# Patient Record
Sex: Female | Born: 1950
Health system: Southern US, Community
[De-identification: ages and names within clinical notes are randomized; demographics above are authoritative.]

## PROBLEM LIST (undated history)

## (undated) DIAGNOSIS — T4145XA Adverse effect of unspecified anesthetic, initial encounter: Secondary | ICD-10-CM

## (undated) DIAGNOSIS — M1990 Unspecified osteoarthritis, unspecified site: Secondary | ICD-10-CM

## (undated) DIAGNOSIS — H269 Unspecified cataract: Secondary | ICD-10-CM

## (undated) DIAGNOSIS — Z860100 Personal history of colon polyps, unspecified: Secondary | ICD-10-CM

## (undated) DIAGNOSIS — K635 Polyp of colon: Secondary | ICD-10-CM

## (undated) DIAGNOSIS — T8859XA Other complications of anesthesia, initial encounter: Secondary | ICD-10-CM

## (undated) DIAGNOSIS — E785 Hyperlipidemia, unspecified: Secondary | ICD-10-CM

## (undated) DIAGNOSIS — E079 Disorder of thyroid, unspecified: Secondary | ICD-10-CM

## (undated) DIAGNOSIS — I10 Essential (primary) hypertension: Secondary | ICD-10-CM

## (undated) DIAGNOSIS — T7840XA Allergy, unspecified, initial encounter: Secondary | ICD-10-CM

## (undated) DIAGNOSIS — Z820 Family history of epilepsy and other diseases of the nervous system: Secondary | ICD-10-CM

## (undated) DIAGNOSIS — Z9889 Other specified postprocedural states: Secondary | ICD-10-CM

## (undated) DIAGNOSIS — R112 Nausea with vomiting, unspecified: Secondary | ICD-10-CM

## (undated) DIAGNOSIS — D869 Sarcoidosis, unspecified: Secondary | ICD-10-CM

## (undated) DIAGNOSIS — Z974 Presence of external hearing-aid: Secondary | ICD-10-CM

## (undated) HISTORY — PX: TEMPOROMANDIBULAR JOINT SURGERY: SHX35

## (undated) HISTORY — DX: Unspecified osteoarthritis, unspecified site: M19.90

## (undated) HISTORY — DX: Hyperlipidemia, unspecified: E78.5

## (undated) HISTORY — DX: Unspecified cataract: H26.9

## (undated) HISTORY — DX: Disorder of thyroid, unspecified: E07.9

## (undated) HISTORY — DX: Presence of external hearing-aid: Z97.4

## (undated) HISTORY — DX: Allergy, unspecified, initial encounter: T78.40XA

## (undated) HISTORY — DX: Family history of epilepsy and other diseases of the nervous system: Z82.0

## (undated) HISTORY — PX: OTHER SURGICAL HISTORY: SHX169

## (undated) HISTORY — PX: COLONOSCOPY: SHX174

## (undated) HISTORY — DX: Polyp of colon: K63.5

## (undated) HISTORY — DX: Personal history of colon polyps, unspecified: Z86.0100

## (undated) HISTORY — PX: POLYPECTOMY: SHX149

## (undated) HISTORY — DX: Personal history of colonic polyps: Z86.010

---

## 2002-07-15 ENCOUNTER — Encounter (INDEPENDENT_AMBULATORY_CARE_PROVIDER_SITE_OTHER): Payer: Self-pay | Admitting: *Deleted

## 2002-07-22 ENCOUNTER — Encounter (INDEPENDENT_AMBULATORY_CARE_PROVIDER_SITE_OTHER): Payer: Self-pay | Admitting: *Deleted

## 2006-09-12 ENCOUNTER — Encounter: Admission: RE | Admit: 2006-09-12 | Discharge: 2006-09-12 | Payer: Self-pay | Admitting: Obstetrics and Gynecology

## 2007-10-23 ENCOUNTER — Encounter: Admission: RE | Admit: 2007-10-23 | Discharge: 2007-10-23 | Payer: Self-pay | Admitting: Obstetrics and Gynecology

## 2007-10-30 ENCOUNTER — Encounter: Admission: RE | Admit: 2007-10-30 | Discharge: 2007-10-30 | Payer: Self-pay | Admitting: Obstetrics and Gynecology

## 2007-12-13 DIAGNOSIS — H109 Unspecified conjunctivitis: Secondary | ICD-10-CM | POA: Insufficient documentation

## 2007-12-13 DIAGNOSIS — R1013 Epigastric pain: Secondary | ICD-10-CM | POA: Insufficient documentation

## 2007-12-20 DIAGNOSIS — K219 Gastro-esophageal reflux disease without esophagitis: Secondary | ICD-10-CM | POA: Insufficient documentation

## 2007-12-20 DIAGNOSIS — H1045 Other chronic allergic conjunctivitis: Secondary | ICD-10-CM | POA: Insufficient documentation

## 2007-12-20 DIAGNOSIS — R232 Flushing: Secondary | ICD-10-CM | POA: Insufficient documentation

## 2007-12-20 DIAGNOSIS — J309 Allergic rhinitis, unspecified: Secondary | ICD-10-CM | POA: Insufficient documentation

## 2007-12-28 ENCOUNTER — Encounter: Admission: RE | Admit: 2007-12-28 | Discharge: 2007-12-28 | Payer: Self-pay | Admitting: Family Medicine

## 2008-11-27 ENCOUNTER — Encounter: Admission: RE | Admit: 2008-11-27 | Discharge: 2008-11-27 | Payer: Self-pay | Admitting: Obstetrics and Gynecology

## 2009-12-15 ENCOUNTER — Encounter: Admission: RE | Admit: 2009-12-15 | Discharge: 2009-12-15 | Payer: Self-pay | Admitting: Obstetrics and Gynecology

## 2010-05-31 ENCOUNTER — Encounter (INDEPENDENT_AMBULATORY_CARE_PROVIDER_SITE_OTHER): Payer: Self-pay | Admitting: *Deleted

## 2010-05-31 ENCOUNTER — Other Ambulatory Visit: Payer: Self-pay | Admitting: Family Medicine

## 2010-06-04 ENCOUNTER — Encounter: Payer: Self-pay | Admitting: *Deleted

## 2010-06-08 ENCOUNTER — Encounter: Payer: Self-pay | Admitting: Internal Medicine

## 2010-06-08 ENCOUNTER — Telehealth: Payer: Self-pay | Admitting: Internal Medicine

## 2010-06-08 NOTE — Letter (Signed)
Summary: Pre Visit Letter Revised  [facility]  [address] [place], [place] [address]   Phone: [phone]  Fax: [phone]        05/31/2010 MRN: [id number] Patricia Chambers [address] [place], [place]  [address]             Procedure Date:  June 22, 2010   recall col-Dr Dalene Carrow to the Gastroenterology Division at [facility].    You are scheduled to see a nurse for your pre-procedure visit on June 08, 2010 at 2:00pm on the 3rd floor at [facility], [address]  We ask that you try to arrive at our office 15 minutes prior to your appointment time to allow for check-in.  Please take a minute to review the attached form.  If you answer "Yes" to one or more of the questions on the first page, we ask that you call the person listed at your earliest opportunity.  If you answer "No" to all of the questions, please complete the rest of the form and bring it to your appointment.    Your nurse visit will consist of discussing your medical and surgical history, your immediate family medical history, and your medications.   If you are unable to list all of your medications on the form, please bring the medication bottles to your appointment and we will list them.  We will need to be aware of both prescribed and over the counter drugs.  We will need to know exact dosage information as well.    Please be prepared to read and sign documents such as consent forms, a financial agreement, and acknowledgement forms.  If necessary, and with your consent, a friend or relative is welcome to sit-in on the nurse visit with you.  Please bring your insurance card so that we may make a copy of it.  If your insurance requires a referral to see a specialist, please bring your referral form from your primary care physician.  No co-pay is required for this nurse visit.     If you cannot keep your appointment, please call [phone] to cancel or reschedule prior to  your appointment date.  This allows [place] the opportunity to schedule an appointment for another patient in need of care.    Thank you for choosing [facility] for your medical needs.  We appreciate the opportunity to care for you.  Please visit [place] at our website  to learn more about our practice.  Sincerely, The Gastroenterology Division

## 2010-06-10 ENCOUNTER — Telehealth (INDEPENDENT_AMBULATORY_CARE_PROVIDER_SITE_OTHER): Payer: Self-pay | Admitting: *Deleted

## 2010-06-15 NOTE — Letter (Signed)
Summary: Dr Samuella Cota Office Note  Dr Samuella Cota Office Note   Imported By: Lamona Curl CMA (AAMA) 06/10/2010 17:02:02  _____________________________________________________________________  External Attachment:    Type:   Image     Comment:   External Document

## 2010-06-15 NOTE — Letter (Signed)
Summary: [facility] Instructions  [facility]  [address] [place], [place] [address]   Phone: [phone]  Fax: [phone]       Jlee Tribbett    02-06-1951    MRN: [id number]        Procedure Day /Date: Tuesday 06-22-10     Arrival Time: 10:30 am     Procedure Time: 11:30 am     Location of Procedure:                    _x _   Endoscopy Center (4th Floor)   PREPARATION FOR COLONOSCOPY WITH MOVIPREP   Starting 5 days prior to your procedure  06-17-10 do not eat nuts, seeds, popcorn, corn, beans, peas,  salads, or any raw vegetables.  Do not take any fiber supplements (e.g. Metamucil, Citrucel, and Benefiber).  THE DAY BEFORE YOUR PROCEDURE         DATE: 06-21-10  DAY:  Monday   1.  Drink clear liquids the entire day-NO SOLID FOOD  2.  Do not drink anything colored red or purple.  Avoid juices with pulp.  No orange juice.  3.  Drink at least 64 oz. (8 glasses) of fluid/clear liquids during the day to prevent dehydration and help the prep work efficiently.  CLEAR LIQUIDS INCLUDE: Water Jello Ice Popsicles Tea (sugar ok, no milk/cream) Powdered fruit flavored drinks Coffee (sugar ok, no milk/cream) Gatorade Juice: apple, white grape, white cranberry  Lemonade Clear bullion, consomm, broth Carbonated beverages (any kind) Strained chicken noodle soup Hard Candy                             4.  In the morning, mix first dose of MoviPrep solution:    Empty 1 Pouch A and 1 Pouch B into the disposable container    Add lukewarm drinking water to the top line of the container. Mix to dissolve    Refrigerate (mixed solution should be used within 24 hrs)  5.  Begin drinking the prep at 5:00 p.m. The MoviPrep container is divided by 4 marks.   Every 15 minutes drink the solution down to the next mark (approximately 8 oz) until the full liter is complete.   6.  Follow completed prep with 16 oz of clear liquid of your choice (Nothing red or purple).   Continue to drink clear liquids until bedtime.  7.  Before going to bed, mix second dose of MoviPrep solution:    Empty 1 Pouch A and 1 Pouch B into the disposable container    Add lukewarm drinking water to the top line of the container. Mix to dissolve    Refrigerate  THE DAY OF YOUR PROCEDURE      DATE:  06-22-10  DAY:  Tuesday  Beginning at  6:30 a.m. (5 hours before procedure):         1. Every 15 minutes, drink the solution down to the next mark (approx 8 oz) until the full liter is complete.  2. Follow completed prep with 16 oz. of clear liquid of your choice.    3. You may drink clear liquids until  9:30 a.m. (2 HOURS BEFORE PROCEDURE).   MEDICATION INSTRUCTIONS  Unless otherwise instructed, you should take regular prescription medications with a small sip of water   as early as possible the morning of your procedure.           OTHER  INSTRUCTIONS  You will need a responsible adult at least 61 years of age to accompany you and drive you home.   This person must remain in the waiting room during your procedure.  Wear loose fitting clothing that is easily removed.  Leave jewelry and other valuables at home.  However, you may wish to bring a book to read or  an iPod/MP3 player to listen to music as you wait for your procedure to start.  Remove all body piercing jewelry and leave at home.  Total time from sign-in until discharge is approximately 2-3 hours.  You should go home directly after your procedure and rest.  You can resume normal activities the  day after your procedure.  The day of your procedure you should not:   Drive   Make legal decisions   Operate machinery   Drink alcohol   Return to work  You will receive specific instructions about eating, activities and medications before you leave.    The above instructions have been reviewed and explained to me by   Ezra Sites RN  June 08, 2010 2:35 PM   I fully understand and can verbalize  these instructions _____________________________ Date _________

## 2010-06-15 NOTE — Letter (Signed)
Summary: Pathology (endo/colon)  Pathology (endo/colon)   Imported By: Lamona Curl CMA (AAMA) 06/11/2010 09:35:33  _____________________________________________________________________  External Attachment:    Type:   Image     Comment:   External Document

## 2010-06-15 NOTE — Progress Notes (Signed)
  Phone Note Other Incoming   Request: Send information Summary of Call: Records received from Dr. Knute Neu. 5 pages forwarded to Dr. Juanda Chance for review.

## 2010-06-15 NOTE — Letter (Signed)
Summary: COLON (Dr Samuella Cota)  COLON (Dr Samuella Cota)   Imported By: Lamona Curl CMA (AAMA) 06/11/2010 09:39:08  _____________________________________________________________________  External Attachment:    Type:   Image     Comment:   External Document

## 2010-06-15 NOTE — Letter (Signed)
Summary: EGD (Dr Samuella Cota)  EGD (Dr Samuella Cota)   Imported By: Lamona Curl CMA (AAMA) 06/11/2010 09:34:36  _____________________________________________________________________  External Attachment:    Type:   Image     Comment:   External Document

## 2010-06-15 NOTE — Miscellaneous (Signed)
Summary: [facility]  Clinical Lists Changes  Medications: Added new medication of MOVIPREP 100 GM  SOLR (PEG-KCL-NACL-NASULF-NA ASC-C) As per prep instructions. - Signed Rx of MOVIPREP 100 GM  SOLR (PEG-KCL-NACL-NASULF-NA ASC-C) As per prep instructions.;  #1 x 0;  Signed;  Entered by: Ezra Sites RN;  Authorized by: Hart Carwin MD;  Method used: Electronically to [facility] # [address], [place], [place]  [address], Ph: [phone], Fax: [phone] Allergies: Added new allergy or adverse reaction of PCN Observations: Added new observation of NKA: F (06/08/2010 13:52)    Prescriptions: MOVIPREP 100 GM  SOLR (PEG-KCL-NACL-NASULF-NA ASC-C) As per prep instructions.  #1 x 0   Entered by:   Ezra Sites RN   Authorized by:   Hart Carwin MD   Signed by:   Ezra Sites RN on 06/08/2010   Method used:   Electronically to        [facility] # [address]* (retail)       [address]       [place], [place]  [address]       Ph: [phone]       Fax: [phone]   RxID:   [id number]

## 2010-06-15 NOTE — Progress Notes (Signed)
Summary: Info so we can request last Colon  Phone Note Call from Patient Call back at Home Phone [phone]   Call For: Dr Juanda Chance Summary of Call: Needs to have records sent to [place] could not remember Correct Clinic when she was here earlier - Dr Knute Neu @ [facility] [address] [place] [phone] 2005 she believes. She was told to ask for Dottie & give this info. Initial call taken by: Leanor Kail [facility],  June 08, 2010 3:11 PM  Follow-up for Phone Call        records requested. Awaiting response. Follow-up by: Lamona Curl CMA Duncan Dull),  June 08, 2010 4:02 PM

## 2010-06-21 ENCOUNTER — Encounter: Payer: Self-pay | Admitting: Internal Medicine

## 2010-06-22 ENCOUNTER — Ambulatory Visit (AMBULATORY_SURGERY_CENTER): Payer: BC Managed Care – PPO | Admitting: Internal Medicine

## 2010-06-22 ENCOUNTER — Encounter: Payer: Self-pay | Admitting: Internal Medicine

## 2010-06-22 VITALS — BP 144/82 | HR 83 | Temp 98.1°F | Resp 12 | Ht 67.0 in | Wt 180.0 lb

## 2010-06-22 DIAGNOSIS — Z1211 Encounter for screening for malignant neoplasm of colon: Secondary | ICD-10-CM

## 2010-06-22 DIAGNOSIS — Z86010 Personal history of colonic polyps: Secondary | ICD-10-CM

## 2010-06-22 DIAGNOSIS — K5730 Diverticulosis of large intestine without perforation or abscess without bleeding: Secondary | ICD-10-CM

## 2010-06-22 NOTE — Patient Instructions (Signed)
Findings: Diverticulosis  Recommendations: 10 year colonoscopy recall                                   High fiber diet

## 2010-06-23 ENCOUNTER — Telehealth: Payer: Self-pay

## 2010-06-23 NOTE — Telephone Encounter (Signed)

## 2010-06-29 NOTE — Procedures (Signed)
Summary: Colonoscopy  Patient: [name] Note: All result statuses are Final unless otherwise noted.  Tests: (1) Colonoscopy (COL)   COL Colonoscopy           DONE     [facility]     [address]     [place], [place]  [address]          COLONOSCOPY PROCEDURE REPORT          PATIENT:  Patricia Chambers, Patricia Chambers  MR#:  [id number]     BIRTHDATE:  04/03/1950, 59 yrs. old  GENDER:  female     ENDOSCOPIST:  Hedwig Morton. Juanda Chance, MD     REF. BY:  Ancil Boozer, M.D.     PROCEDURE DATE:  06/22/2010     PROCEDURE:  Colonoscopy [address]     ASA CLASS:  Class II     INDICATIONS:  colorectal cancer screening, average risk, history     of hyperplastic polyps hyperplastic polyp in 2004     MEDICATIONS:   Versed 10 mg, Fentanyl 100 mcg          DESCRIPTION OF PROCEDURE:   After the risks benefits and     alternatives of the procedure were thoroughly explained, informed     consent was obtained.  Digital rectal exam was performed and     revealed no rectal masses.   The LB [id number]-[id number] endoscope     was introduced through the anus and advanced to the cecum, which     was identified by both the appendix and ileocecal valve, without     limitations.  The quality of the prep was good, using MoviPrep.     The instrument was then slowly withdrawn as the colon was fully     examined.     <<PROCEDUREIMAGES>>          FINDINGS:  Moderate diverticulosis was found throughout the colon     (see image1, image2, image6, image7, and image8).  This was     otherwise a normal examination of the colon (see image9, image5,     image4, and image3).   Retroflexed views in the rectum revealed no     abnormalities.    The scope was then withdrawn from the patient     and the procedure completed.          COMPLICATIONS:  None     ENDOSCOPIC IMPRESSION:     1) Moderate diverticulosis throughout the colon     2) Otherwise normal examination     RECOMMENDATIONS:     1) high fiber diet     REPEAT EXAM:  In  10 year(s) for.          ______________________________     Hedwig Morton. Juanda Chance, MD          CC:          n.     eSIGNED:   Hedwig Morton. Ranette Luckadoo at 06/22/2010 11:54 AM          Nilsa Nutting, [id number]  Note: An exclamation mark (!) indicates a result that was not dispersed into the flowsheet. Document Creation Date: 06/22/2010 11:54 AM _______________________________________________________________________  (1) Order result status: Final Collection or observation date-time: 06/22/2010 11:39 Requested date-time:  Receipt date-time:  Reported date-time:  Referring Physician:   Ordering Physician: Lina Sar [id number]) Specimen Source:  Source: Launa Grill Order Number: [id number] Lab site:

## 2010-11-15 ENCOUNTER — Emergency Department (HOSPITAL_COMMUNITY)
Admission: EM | Admit: 2010-11-15 | Discharge: 2010-11-15 | Disposition: A | Discharge status: Home / Self Care | Payer: BC Managed Care – PPO | Attending: Emergency Medicine | Admitting: Emergency Medicine

## 2010-11-15 ENCOUNTER — Emergency Department (HOSPITAL_COMMUNITY): Payer: BC Managed Care – PPO

## 2010-11-15 DIAGNOSIS — M545 Low back pain, unspecified: Secondary | ICD-10-CM | POA: Insufficient documentation

## 2010-11-15 DIAGNOSIS — M25559 Pain in unspecified hip: Secondary | ICD-10-CM | POA: Insufficient documentation

## 2010-11-15 DIAGNOSIS — M549 Dorsalgia, unspecified: Secondary | ICD-10-CM | POA: Insufficient documentation

## 2010-11-24 ENCOUNTER — Other Ambulatory Visit: Payer: Self-pay | Admitting: Family Medicine

## 2010-11-24 DIAGNOSIS — Z1231 Encounter for screening mammogram for malignant neoplasm of breast: Secondary | ICD-10-CM

## 2010-12-21 ENCOUNTER — Ambulatory Visit
Admission: RE | Admit: 2010-12-21 | Discharge: 2010-12-21 | Disposition: A | Discharge status: Home / Self Care | Payer: BC Managed Care – PPO | Source: Ambulatory Visit | Attending: Family Medicine | Admitting: Family Medicine

## 2010-12-21 DIAGNOSIS — Z1231 Encounter for screening mammogram for malignant neoplasm of breast: Secondary | ICD-10-CM

## 2011-10-31 ENCOUNTER — Other Ambulatory Visit: Payer: Self-pay | Admitting: Family Medicine

## 2011-10-31 DIAGNOSIS — Z1151 Encounter for screening for human papillomavirus (HPV): Secondary | ICD-10-CM | POA: Insufficient documentation

## 2011-10-31 DIAGNOSIS — Z124 Encounter for screening for malignant neoplasm of cervix: Secondary | ICD-10-CM | POA: Insufficient documentation

## 2011-11-04 ENCOUNTER — Other Ambulatory Visit (HOSPITAL_COMMUNITY)
Admission: RE | Admit: 2011-11-04 | Discharge: 2011-11-04 | Disposition: A | Discharge status: Home / Self Care | Payer: BC Managed Care – PPO | Source: Ambulatory Visit | Attending: Family Medicine | Admitting: Family Medicine

## 2011-12-14 ENCOUNTER — Other Ambulatory Visit: Payer: Self-pay | Admitting: Family Medicine

## 2011-12-14 DIAGNOSIS — Z1231 Encounter for screening mammogram for malignant neoplasm of breast: Secondary | ICD-10-CM

## 2012-01-03 ENCOUNTER — Ambulatory Visit
Admission: RE | Admit: 2012-01-03 | Discharge: 2012-01-03 | Disposition: A | Discharge status: Home / Self Care | Payer: BC Managed Care – PPO | Source: Ambulatory Visit | Attending: Family Medicine | Admitting: Family Medicine

## 2012-01-03 DIAGNOSIS — Z1231 Encounter for screening mammogram for malignant neoplasm of breast: Secondary | ICD-10-CM

## 2013-06-14 ENCOUNTER — Encounter: Payer: Self-pay | Admitting: Internal Medicine

## 2013-06-14 ENCOUNTER — Ambulatory Visit (INDEPENDENT_AMBULATORY_CARE_PROVIDER_SITE_OTHER): Payer: BC Managed Care – PPO | Admitting: Internal Medicine

## 2013-06-14 VITALS — BP 135/95 | HR 90 | Ht 67.0 in | Wt 188.8 lb

## 2013-06-14 DIAGNOSIS — R079 Chest pain, unspecified: Secondary | ICD-10-CM

## 2013-06-14 DIAGNOSIS — R0602 Shortness of breath: Secondary | ICD-10-CM

## 2013-06-14 LAB — LIPID PANEL
Cholesterol: 245 mg/dL — ABNORMAL HIGH (ref 0–200)
HDL: 55.4 mg/dL (ref >39.00)
LDL Cholesterol: 150 mg/dL — ABNORMAL HIGH (ref 0–99)
Total CHOL/HDL Ratio: 4
Triglycerides: 200 mg/dL — ABNORMAL HIGH (ref 0.0–149.0)
VLDL: 40 mg/dL (ref 0.0–40.0)

## 2013-06-14 NOTE — Progress Notes (Signed)
HPI BP has been mildly elevated  Chol 229 1 year ago  [employer].  Then fell off diet Pain came and went then to R jaw  Went awy  No more since  SInce then hasn;t felt right.  Taking BP it has been high  Lows have been 138/80  SOB on Tues  Walked from car to house  Had to stop This was odd.   Tues night got to go to [facility]  Back in bed.  Racing  Wed went to Dr Orland Mustard  No SOB  But tight Took med  Tightness gone.     Allergies  Allergen Reactions  . Penicillins     REACTION: as infant    Current Outpatient Prescriptions  Medication Sig Dispense Refill  . Ascorbic Acid (VITAMIN C) 500 MG tablet Take 500 mg by mouth daily.        . hydrochlorothiazide (MICROZIDE) 12.5 MG capsule Take 12.5 mg by mouth daily.      . multivitamin (THERAGRAN) per tablet Take 1 tablet by mouth daily.        . Omega-3 Fatty Acids (FISH OIL PO) Take 500 mg by mouth daily.         No current facility-administered medications for this visit.    Past Medical History  Diagnosis Date  . Asthma   . FHx: migraine headaches   . Hx of colonic polyps     Past Surgical History  Procedure Laterality Date  . Temporomandibular joint surgery      No family history on file.  History   Social History  . Marital Status: Married    Spouse Name: N/A    Number of Children: N/A  . Years of Education: N/A   Occupational History  . Not on file.   Social History Main Topics  . Smoking status: Former Research scientist (life sciences)  . Smokeless tobacco: Not on file  . Alcohol Use: 3.0 oz/week    5 Glasses of wine per week  . Drug Use: No  . Sexual Activity:    Other Topics Concern  . Not on file   Social History Narrative  . No narrative on file    Review of Systems:  All systems reviewed.  They are negative to the above problem except as previously stated.  Vital Signs: BP 135/95  Pulse 90  Ht 5\' 7"  (1.702 m)  Wt 188 lb 12.8 oz (85.639 kg)  BMI 29.56 kg/m2  Physical Exam Patient is i nNAD HEENT:  Normocephalic,  atraumatic. EOMI, PERRLA.  Neck: JVP is normal.  No bruits.  Lungs: clear to auscultation. No rales no wheezes.  Heart: Regular rate and rhythm. Normal S1, S2. No S3.   No significant murmurs. PMI not displaced.  Abdomen:  Supple, nontender. Normal bowel sounds. No masses. No hepatomegaly.  Extremities:   Good distal pulses throughout. No lower extremity edema.  Musculoskeletal :moving all extremities.  Neuro:   alert and oriented x3.  CN II-XII grossly intact.  EKG  NSR 85 bpm.  Nonspeciifc ST T wave changes.  Assessment and Plan:  1.  SOB and tightness.  Concerning  WOuld set up for stress myoview  2.  HTN  BP is high today  Check on return for test.  3.  HL  Need fasting lipids.

## 2013-06-14 NOTE — Patient Instructions (Addendum)
Your physician has requested that you have en exercise stress myoview. For further information please visit [email or web address]. Please follow instruction sheet, as given.  Your physician recommends that you continue on your current medications as directed. Please refer to the Current Medication list given to you today.  Your physician recommends that you return for lab work in: [place]

## 2013-06-20 ENCOUNTER — Emergency Department (HOSPITAL_COMMUNITY): Payer: BC Managed Care – PPO

## 2013-06-20 ENCOUNTER — Observation Stay (HOSPITAL_COMMUNITY)
Admission: EM | Admit: 2013-06-20 | Discharge: 2013-06-21 | Disposition: A | Discharge status: Home / Self Care | Payer: BC Managed Care – PPO | Attending: Internal Medicine | Admitting: Internal Medicine

## 2013-06-20 ENCOUNTER — Encounter (HOSPITAL_COMMUNITY): Payer: Self-pay | Admitting: Emergency Medicine

## 2013-06-20 DIAGNOSIS — Z820 Family history of epilepsy and other diseases of the nervous system: Secondary | ICD-10-CM | POA: Insufficient documentation

## 2013-06-20 DIAGNOSIS — Z8249 Family history of ischemic heart disease and other diseases of the circulatory system: Secondary | ICD-10-CM

## 2013-06-20 DIAGNOSIS — Z860100 Personal history of colon polyps, unspecified: Secondary | ICD-10-CM | POA: Insufficient documentation

## 2013-06-20 DIAGNOSIS — I10 Essential (primary) hypertension: Secondary | ICD-10-CM | POA: Diagnosis present

## 2013-06-20 DIAGNOSIS — D869 Sarcoidosis, unspecified: Secondary | ICD-10-CM | POA: Insufficient documentation

## 2013-06-20 DIAGNOSIS — R079 Chest pain, unspecified: Principal | ICD-10-CM | POA: Diagnosis present

## 2013-06-20 DIAGNOSIS — E785 Hyperlipidemia, unspecified: Secondary | ICD-10-CM | POA: Diagnosis present

## 2013-06-20 DIAGNOSIS — R112 Nausea with vomiting, unspecified: Secondary | ICD-10-CM | POA: Insufficient documentation

## 2013-06-20 HISTORY — DX: Other complications of anesthesia, initial encounter: T88.59XA

## 2013-06-20 HISTORY — DX: Other specified postprocedural states: R11.2

## 2013-06-20 HISTORY — DX: Adverse effect of unspecified anesthetic, initial encounter: T41.45XA

## 2013-06-20 HISTORY — DX: Essential (primary) hypertension: I10

## 2013-06-20 HISTORY — DX: Other specified postprocedural states: Z98.890

## 2013-06-20 HISTORY — DX: Sarcoidosis, unspecified: D86.9

## 2013-06-20 LAB — COMPREHENSIVE METABOLIC PANEL
ALT: 23 U/L (ref 0–35)
AST: 25 U/L (ref 0–37)
Albumin: 4.4 g/dL (ref 3.5–5.2)
Alkaline Phosphatase: 51 U/L (ref 39–117)
BUN: 19 mg/dL (ref 6–23)
CO2: 24 mEq/L (ref 19–32)
Calcium: 9.6 mg/dL (ref 8.4–10.5)
Chloride: 99 mEq/L (ref 96–112)
Creatinine, Ser: 0.99 mg/dL (ref 0.50–1.10)
GFR calc Af Amer: 69 mL/min — ABNORMAL LOW (ref >90)
GFR calc non Af Amer: 59 mL/min — ABNORMAL LOW (ref >90)
Glucose, Bld: 108 mg/dL — ABNORMAL HIGH (ref 70–99)
Potassium: 3.4 mEq/L — ABNORMAL LOW (ref 3.7–5.3)
Sodium: 139 mEq/L (ref 137–147)
Total Bilirubin: 0.6 mg/dL (ref 0.3–1.2)
Total Protein: 7.2 g/dL (ref 6.0–8.3)

## 2013-06-20 LAB — CBC
HCT: 40.1 % (ref 36.0–46.0)
Hemoglobin: 13.5 g/dL (ref 12.0–15.0)
MCH: 30.7 pg (ref 26.0–34.0)
MCHC: 33.7 g/dL (ref 30.0–36.0)
MCV: 91.1 fL (ref 78.0–100.0)
Platelets: 222 10*3/uL (ref 150–400)
RBC: 4.4 MIL/uL (ref 3.87–5.11)
RDW: 13 % (ref 11.5–15.5)
WBC: 6.7 10*3/uL (ref 4.0–10.5)

## 2013-06-20 LAB — I-STAT TROPONIN, ED: Troponin i, poc: 0 ng/mL (ref 0.00–0.08)

## 2013-06-20 MED ORDER — ASPIRIN EC 81 MG PO TBEC: 81.0000 mg | DELAYED_RELEASE_TABLET | Freq: Every day | ORAL | Status: DC

## 2013-06-20 MED ORDER — MULTIVITAMINS PO TABS: 1.0000 | ORAL_TABLET | Freq: Every day | ORAL | Status: DC

## 2013-06-20 MED ORDER — HYDROCHLOROTHIAZIDE 12.5 MG PO CAPS
12.5000 mg | ORAL_CAPSULE | Freq: Every day | ORAL | Status: DC
  Administered 2013-06-21: 12.5 mg via ORAL
  Filled 2013-06-20: qty 1

## 2013-06-20 MED ORDER — NITROGLYCERIN 0.4 MG SL SUBL: 0.4000 mg | SUBLINGUAL_TABLET | SUBLINGUAL | Status: DC | PRN

## 2013-06-20 MED ORDER — ENOXAPARIN SODIUM 40 MG/0.4ML ~~LOC~~ SOLN: 40.0000 mg | SUBCUTANEOUS | Status: DC

## 2013-06-20 MED ORDER — ASPIRIN EC 81 MG PO TBEC
81.0000 mg | DELAYED_RELEASE_TABLET | Freq: Every day | ORAL | Status: DC
  Administered 2013-06-21: 81 mg via ORAL
  Filled 2013-06-20: qty 1

## 2013-06-20 MED ORDER — ENOXAPARIN SODIUM 40 MG/0.4ML ~~LOC~~ SOLN
40.0000 mg | SUBCUTANEOUS | Status: DC
  Administered 2013-06-21: 40 mg via SUBCUTANEOUS
  Filled 2013-06-20 (×2): qty 0.4

## 2013-06-20 MED ORDER — ASPIRIN 81 MG PO CHEW
324.0000 mg | CHEWABLE_TABLET | Freq: Once | ORAL | Status: AC
  Administered 2013-06-20: 324 mg via ORAL
  Filled 2013-06-20 (×2): qty 4

## 2013-06-20 MED ORDER — VITAMIN C 500 MG PO TABS
500.0000 mg | ORAL_TABLET | Freq: Every day | ORAL | Status: DC
  Administered 2013-06-21: 500 mg via ORAL
  Filled 2013-06-20: qty 1

## 2013-06-20 MED ORDER — ATORVASTATIN CALCIUM 80 MG PO TABS
80.0000 mg | ORAL_TABLET | Freq: Every day | ORAL | Status: DC
  Filled 2013-06-20: qty 1

## 2013-06-20 MED ORDER — ADULT MULTIVITAMIN W/MINERALS CH
1.0000 | ORAL_TABLET | Freq: Every day | ORAL | Status: DC
  Administered 2013-06-21: 1 via ORAL
  Filled 2013-06-20: qty 1

## 2013-06-20 MED ORDER — NITROGLYCERIN 0.4 MG SL SUBL
0.4000 mg | SUBLINGUAL_TABLET | SUBLINGUAL | Status: DC | PRN
  Administered 2013-06-20 (×2): 0.4 mg via SUBLINGUAL
  Filled 2013-06-20: qty 1

## 2013-06-20 NOTE — H&P (Signed)
Patient ID: Patricia Chambers MRN: [id number], DOB/AGE: 1951-01-03   Admit date: 06/20/2013   Primary Physician: Wynelle Fanny Primary Cardiologist: Dorris Carnes, MD  Pt. Profile:  63F with history of HTN and HLD and remote history of smoking admitted with chest pain.   Problem List  Past Medical History  Diagnosis Date  . Asthma   . FHx: migraine headaches   . Hx of colonic polyps   . Hypertension   . Sarcoidosis   . Complication of anesthesia     hard to wake up  . PONV (postoperative nausea and vomiting)     Past Surgical History  Procedure Laterality Date  . Temporomandibular joint surgery       Allergies  Allergies  Allergen Reactions  . Naproxen     Esophageal burning sensation.    Marland Kitchen Penicillins     REACTION: as infant    HPI  63F with history of HTN and HLD and remote history of smoking admitted with chest pain.   States 10 days ago she developed 4-5/10 chest and jaw pain while seated on her porch drinking wine and watching her dog play. Debated whether to call EMS but pain subsided after 30 minutes. The day after she developed SOB and fatigue.   She saw a physician and was noted to be hypertensive at 160s/90s. She was started on HCTZ and stress test was planned for 4/1.  Today while in the line at [facility] She developed more severe 5-6/10 chest and jaw pain with "queesyness" and lightheadedness. EMS was called. Episode lasted 30 minutes. Reports after it subsided she had a residual tightness which resolved with nitro. She was hypertensive (164/88) and mildly tachycardic (110) on arrival.   States she has had significant increase in stress lately associated with husband's Merkel cell cancer and resultant financial issues.  Also states she had recent lipid panel that demonstrated TC of 245 and HDL 55 -- cannot remember LDL. Reports strong FH of CAD in mother's side.   Home Medications  Prior to Admission medications   Medication Sig Start Date  End Date Taking? Authorizing Provider  Ascorbic Acid (VITAMIN C) 500 MG tablet Take 500 mg by mouth daily.     Yes Historical Provider, MD  aspirin EC 81 MG tablet Take 81 mg by mouth daily.   Yes Historical Provider, MD  hydrochlorothiazide (MICROZIDE) 12.5 MG capsule Take 12.5 mg by mouth daily. 06/12/13  Yes Historical Provider, MD  multivitamin [facility]) per tablet Take 1 tablet by mouth daily.     Yes Historical Provider, MD    Family History  History reviewed. No pertinent family history.  Social History  History   Social History  . Marital Status: Married    Spouse Name: N/A    Number of Children: N/A  . Years of Education: N/A   Occupational History  . Not on file.   Social History Main Topics  . Smoking status: Former Smoker    Quit date: 06/20/1972  . Smokeless tobacco: Never Used  . Alcohol Use: 3.0 oz/week    5 Glasses of wine per week     Comment: Or more  . Drug Use: No  . Sexual Activity: Not on file   Other Topics Concern  . Not on file   Social History Narrative  . No narrative on file     Review of Systems General:  No chills, fever, night sweats or weight changes.  Cardiovascular:  Noedema, orthopnea, palpitations, paroxysmal nocturnal  dyspnea. Dermatological: No rash, lesions/masses Respiratory: No cough, dyspnea Urologic: No hematuria, dysuria Abdominal:   No nausea, vomiting, diarrhea, bright red blood per rectum, melena, or hematemesis Neurologic:  No visual changes, wkns, changes in mental status. All other systems reviewed and are otherwise negative except as noted above.  Physical Exam  Blood pressure 149/80, pulse 81, temperature 98.9 F (37.2 C), temperature source Oral, resp. rate 18, height 5\' 7"  (1.702 m), weight 84.913 kg (187 lb 3.2 oz), SpO2 99.00%.  General: Pleasant, NAD Psych: Normal affect. Neuro: Alert and oriented X 3. Moves all extremities spontaneously. HEENT: Normal  Neck: Supple without bruits or JVD. Lungs:   Resp regular and unlabored, CTA. Heart: RRR no s3, s4, or murmurs. Abdomen: Soft, non-tender, non-distended, BS + x 4.  Extremities: No clubbing, cyanosis or edema. DP/PT/Radials 2+ and equal bilaterally.  Labs  Troponin [facility] of Care Test)  Recent Labs  06/20/13 1601  TROPIPOC 0.00   No results found for this basename: CKTOTAL, CKMB, TROPONINI,  in the last 72 hours Lab Results  Component Value Date   WBC 6.7 06/20/2013   HGB 13.5 06/20/2013   HCT 40.1 06/20/2013   MCV 91.1 06/20/2013   PLT 222 06/20/2013    Recent Labs Lab 06/20/13 1556  NA 139  K 3.4*  CL 99  CO2 24  BUN 19  CREATININE 0.99  CALCIUM 9.6  PROT 7.2  BILITOT 0.6  ALKPHOS 51  ALT 23  AST 25  GLUCOSE 108*   Lab Results  Component Value Date   CHOL 245* 06/14/2013   HDL 55.40 06/14/2013   LDLCALC 150* 06/14/2013   TRIG 200.0* 06/14/2013   No results found for this basename: DDIMER     Radiology/Studies  Dg Chest 2 View  06/20/2013   CLINICAL DATA:  Chest pain.  History of hypertension and asthma.  EXAM: CHEST  2 VIEW  COMPARISON:  None.  FINDINGS: The heart size and mediastinal contours are within normal limits. Both lungs are clear. The visualized skeletal structures are unremarkable.  IMPRESSION: No active cardiopulmonary disease.   Electronically Signed   By: Shon Hale M.D.   On: 06/20/2013 16:48    ECG NSR. No STTWC c/w acute ischemia  ASSESSMENT AND PLAN  63F with history of HTN, HLD, remote history of smoking, with strong FH of CAD who admitted with typical anginal symptoms.  NPO for stress test in AM.   Signed, Lamar Sprinkles, MD 06/20/2013, 9:42 PM

## 2013-06-20 NOTE — ED Provider Notes (Signed)
CSN: [id number]     Arrival date & time 06/20/13  1523 History   First MD Initiated Contact with Patient 06/20/13 1639     Chief Complaint  Patient presents with  . Chest Pain     (Consider location/radiation/quality/duration/timing/severity/associated sxs/prior Treatment) HPI  This is a 63 year old female with a history of hypertension and hyperlipidemia who presents with chest pain. Patient reports she had onset of chest pain at 3 PM this afternoon while she was grocery shopping. She reports that it is midsternal and nonradiating. She reports that it was a "heaviness." She states that it lasted approximately 30 minutes.  Patient also reported jaw pain. States that she had a similar episode 2 weeks ago and saw her primary Dr. With that episode she also had some shortness of breath and dizziness. She was referred to cardiology and has a stress test scheduled for April 1. She was seen by Wyatt Haste. Patient currently rates pain 3/10. She has not taken aspirin today. Strong family history of heart disease.  Past Medical History  Diagnosis Date  . Asthma   . FHx: migraine headaches   . Hx of colonic polyps   . Hypertension    Past Surgical History  Procedure Laterality Date  . Temporomandibular joint surgery     No family history on file. History  Substance Use Topics  . Smoking status: Former Research scientist (life sciences)  . Smokeless tobacco: Not on file  . Alcohol Use: 3.0 oz/week    5 Glasses of wine per week   OB History   Grav Para Term Preterm Abortions TAB SAB Ect Mult Living                 Review of Systems  Constitutional: Negative for fever.  Respiratory: Positive for chest tightness. Negative for cough and shortness of breath.   Cardiovascular: Positive for chest pain. Negative for palpitations and leg swelling.  Gastrointestinal: Negative for nausea and vomiting.  Genitourinary: Negative for dysuria.  Musculoskeletal: Negative for back pain.  Neurological: Negative for headaches.   All other systems reviewed and are negative.      Allergies  Naproxen and Penicillins  Home Medications   Current Outpatient Rx  Name  Route  Sig  Dispense  Refill  . Ascorbic Acid (VITAMIN C) 500 MG tablet   Oral   Take 500 mg by mouth daily.           Marland Kitchen aspirin EC 81 MG tablet   Oral   Take 81 mg by mouth daily.         . hydrochlorothiazide (MICROZIDE) 12.5 MG capsule   Oral   Take 12.5 mg by mouth daily.         . multivitamin (THERAGRAN) per tablet   Oral   Take 1 tablet by mouth daily.            BP 164/88  Pulse 110  Temp(Src) 97.7 F (36.5 C) (Oral)  Resp 16  Ht 5\' 7"  (1.702 m)  Wt 188 lb (85.276 kg)  BMI 29.44 kg/m2  SpO2 100% Physical Exam  Nursing note and vitals reviewed. Constitutional: She is oriented to person, place, and time. She appears well-developed and well-nourished. No distress.  HENT:  Head: Normocephalic and atraumatic.  Eyes: Pupils are equal, round, and reactive to light.  Neck: Neck supple.  Cardiovascular: Normal rate, regular rhythm and normal heart sounds.   No murmur heard. Pulmonary/Chest: Effort normal and breath sounds normal. No respiratory distress. She has no  wheezes. She has no rales.  Abdominal: Soft. There is no tenderness. There is no rebound.  Musculoskeletal: She exhibits no edema.  Neurological: She is alert and oriented to person, place, and time.  Skin: Skin is warm and dry.  Psychiatric: She has a normal mood and affect.    ED Course  Procedures (including critical care time) Labs Review Labs Reviewed  COMPREHENSIVE METABOLIC PANEL - Abnormal; Notable for the following:    Potassium 3.4 (*)    Glucose, Bld 108 (*)    GFR calc non Af Amer 59 (*)    GFR calc Af Amer 69 (*)    All other components within normal limits  CBC  I-STAT TROPOININ, ED   Imaging Review Dg Chest 2 View  06/20/2013   CLINICAL DATA:  Chest pain.  History of hypertension and asthma.  EXAM: CHEST  2 VIEW  COMPARISON:   None.  FINDINGS: The heart size and mediastinal contours are within normal limits. Both lungs are clear. The visualized skeletal structures are unremarkable.  IMPRESSION: No active cardiopulmonary disease.   Electronically Signed   By: Shon Hale M.D.   On: 06/20/2013 16:48     EKG Interpretation   Date/Time:  Thursday June 20 2013 15:37:23 EDT Ventricular Rate:  96 PR Interval:  158 QRS Duration: 90 QT Interval:  350 QTC Calculation: 442 R Axis:   -7 Text Interpretation:  Sinus rhythm Borderline T wave abnormalities No  prior for comparison Confirmed by Danelle Curiale  MD, Carletha Dawn ([id number]) on  06/20/2013 4:42:58 PM      MDM   Final diagnoses:  Chest pain    Patient presents for chest pain. Story and history is concerning for angina. Patient has multiple risk factors and a family history. Initial EKG is nonischemic. Troponin is negative. Chest x-ray is clear. Patient was given aspirin and nitroglycerin. Will consult cardiology and anticipate admission for expedited stress testing.    Merryl Hacker, MD 06/21/13 [phone]

## 2013-06-20 NOTE — ED Notes (Signed)
Pt reports unchanged chest pressure 3/10 after first dose of nitro.

## 2013-06-20 NOTE — ED Notes (Signed)
Pt states she was at store at 1500 and developed mid sternal chest pain. Pt denies chest pain at present. Pt states that deep breathing made the pain worse. Pt also states that she had L side jaw pain at the time. Pt denies nausea or SOB. Pt states she feels a little lightheaded. Pt alert, no acute distress. Skin warm and dry.

## 2013-06-20 NOTE — ED Notes (Signed)
Attempted to call report, did not hear back from floor. RN getting report on new pts and unable to get report at this time.

## 2013-06-20 NOTE — ED Notes (Signed)
Attempted to call report, Charge RN has not assigned that pt. To a nurse yet.

## 2013-06-20 NOTE — ED Notes (Signed)
Pt denies any chest heaviness, pain, or pressure at this time.

## 2013-06-21 ENCOUNTER — Observation Stay (HOSPITAL_COMMUNITY): Payer: BC Managed Care – PPO

## 2013-06-21 ENCOUNTER — Other Ambulatory Visit: Payer: Self-pay

## 2013-06-21 DIAGNOSIS — R079 Chest pain, unspecified: Secondary | ICD-10-CM

## 2013-06-21 DIAGNOSIS — Z8249 Family history of ischemic heart disease and other diseases of the circulatory system: Secondary | ICD-10-CM

## 2013-06-21 DIAGNOSIS — E785 Hyperlipidemia, unspecified: Secondary | ICD-10-CM | POA: Diagnosis present

## 2013-06-21 DIAGNOSIS — I10 Essential (primary) hypertension: Secondary | ICD-10-CM | POA: Diagnosis present

## 2013-06-21 HISTORY — DX: Essential (primary) hypertension: I10

## 2013-06-21 LAB — TROPONIN I
Troponin I: <0.3 ng/mL (ref <0.30)
Troponin I: <0.3 ng/mL (ref <0.30)
Troponin I: <0.3 ng/mL (ref <0.30)

## 2013-06-21 LAB — LIPID PANEL
Cholesterol: 208 mg/dL — ABNORMAL HIGH (ref 0–200)
HDL: 47 mg/dL (ref >39)
LDL Cholesterol: 119 mg/dL — ABNORMAL HIGH (ref 0–99)
Total CHOL/HDL Ratio: 4.4 RATIO
Triglycerides: 209 mg/dL — ABNORMAL HIGH (ref <150)
VLDL: 42 mg/dL — ABNORMAL HIGH (ref 0–40)

## 2013-06-21 LAB — HEMOGLOBIN A1C
Hgb A1c MFr Bld: 5.3 % (ref <5.7)
Mean Plasma Glucose: 105 mg/dL (ref <117)

## 2013-06-21 LAB — CBC
HCT: 38.9 % (ref 36.0–46.0)
Hemoglobin: 13.2 g/dL (ref 12.0–15.0)
MCH: 31.2 pg (ref 26.0–34.0)
MCHC: 33.9 g/dL (ref 30.0–36.0)
MCV: 92 fL (ref 78.0–100.0)
Platelets: 188 10*3/uL (ref 150–400)
RBC: 4.23 MIL/uL (ref 3.87–5.11)
RDW: 13.2 % (ref 11.5–15.5)
WBC: 5.4 10*3/uL (ref 4.0–10.5)

## 2013-06-21 LAB — CREATININE, SERUM
Creatinine, Ser: 0.84 mg/dL (ref 0.50–1.10)
GFR calc Af Amer: 84 mL/min — ABNORMAL LOW (ref >90)
GFR calc non Af Amer: 72 mL/min — ABNORMAL LOW (ref >90)

## 2013-06-21 MED ORDER — METOPROLOL TARTRATE 25 MG PO TABS: 25.0000 mg | ORAL_TABLET | Freq: Two times a day (BID) | ORAL | Status: DC

## 2013-06-21 MED ORDER — NITROGLYCERIN 0.4 MG SL SUBL: 0.4000 mg | SUBLINGUAL_TABLET | SUBLINGUAL | Status: DC | PRN

## 2013-06-21 MED ORDER — TECHNETIUM TC 99M SESTAMIBI - CARDIOLITE
30.0000 | Freq: Once | INTRAVENOUS | Status: AC | PRN
  Administered 2013-06-21: 30 via INTRAVENOUS

## 2013-06-21 MED ORDER — METOPROLOL TARTRATE 25 MG PO TABS
25.0000 mg | ORAL_TABLET | Freq: Two times a day (BID) | ORAL | Status: DC
  Filled 2013-06-21 (×2): qty 1

## 2013-06-21 MED ORDER — TECHNETIUM TC 99M SESTAMIBI GENERIC - CARDIOLITE
10.0000 | Freq: Once | INTRAVENOUS | Status: AC | PRN
  Administered 2013-06-21: 10 via INTRAVENOUS

## 2013-06-21 MED ORDER — ATORVASTATIN CALCIUM 80 MG PO TABS: 80.0000 mg | ORAL_TABLET | Freq: Every day | ORAL | Status: DC

## 2013-06-21 NOTE — Progress Notes (Signed)
Utilization review completed.  

## 2013-06-21 NOTE — Progress Notes (Signed)
Pt denies pain, d/c'd home per orders. No further questions. D/c'd via wheelchair to car with husband

## 2013-06-21 NOTE — Progress Notes (Signed)
Subjective:  No chest pain overnight  Objective:  Vital Signs in the last 24 hours: Temp:  [97.7 F (36.5 C)-98.9 F (37.2 C)] 98.4 F (36.9 C) (03/27 0545) Pulse Rate:  [68-110] 82 (03/27 0847) Resp:  [16-18] 16 (03/27 0847) BP: (125-164)/(71-90) 155/90 mmHg (03/27 0847) SpO2:  [97 %-100 %] 99 % (03/27 0545) Weight:  [187 lb 3.2 oz (84.913 kg)-188 lb (85.276 kg)] 187 lb 3.2 oz (84.913 kg) (03/26 2058)  Intake/Output from previous day:  Intake/Output Summary (Last 24 hours) at 06/21/13 0856 Last data filed at 06/21/13 0516  Gross per 24 hour  Intake    240 ml  Output      0 ml  Net    240 ml    Physical Exam: General appearance: alert, cooperative and no distress Lungs: clear to auscultation bilaterally Heart: regular rate and rhythm, S1, S2 normal, no murmur, click, rub or gallop   Rate: 66  Rhythm: normal sinus rhythm  Lab Results:  Recent Labs  06/20/13 1556 06/21/13 0041  WBC 6.7 5.4  HGB 13.5 13.2  PLT 222 188    Recent Labs  06/20/13 1556 06/21/13 0041  NA 139  --   K 3.4*  --   CL 99  --   CO2 24  --   GLUCOSE 108*  --   BUN 19  --   CREATININE 0.99 0.84    Recent Labs  06/21/13 0041 06/21/13 0437  TROPONINI <0.30 <0.30   No results found for this basename: INR,  in the last 72 hours  Imaging: Dg Chest 2 View  06/20/2013   CLINICAL DATA:  Chest pain.  History of hypertension and asthma.  EXAM: CHEST  2 VIEW  COMPARISON:  None.  FINDINGS: The heart size and mediastinal contours are within normal limits. Both lungs are clear. The visualized skeletal structures are unremarkable.  IMPRESSION: No active cardiopulmonary disease.   Electronically Signed   By: Shon Hale M.D.   On: 06/20/2013 16:48    Cardiac Studies:  Assessment/Plan:  37F with history of HTN and HLD and remote history of smoking admitted with chest pain. She had just seen Dr Harrington Challenger 06/14/13 for chest pain and was to have an exercise Myoview next week. She presented to the  ER with recurrent chest pain yesterday afternoon. Troponin negative.     Principal Problem:   Chest pain with moderate risk of acute coronary syndrome Active Problems:   HTN (hypertension)   Dyslipidemia   Family history of coronary artery disease    PLAN: Exercise Myoview  Kerin Ransom PA-C Beeper [phone] 06/21/2013, 8:56 AM  Patient seen, examined. Available data reviewed. Agree with findings, assessment, and plan as outlined by Kerin Ransom, PA-C. Exam reveals an alert, oriented woman in no distress. Lungs are clear. Heart is regular rate and rhythm without murmur or gallop. Abdomen is soft nontender. Extremities show no edema.  The patient has been chest pain-free since admission. Her Myoview is reviewed as below:  FINDINGS:  The patient walked on the treadmill for 6 minutes. The study was  stopped for fatigue. There was no significant ST change. Peak heart  rate was 156, above 85% predicted maximum heart rate. There was no  chest pain. There was no significant EKG change. The raw nuclear  data reveals no excess motion. The tomographic images with stress  reveals a medium-sized defect with a moderate decrease activity at  the base/mid/apical inferior segments and the mid inferolateral  segment  and the apical lateral segment. There is mild reversibility.  The image at rest reveals a medium-sized defect with moderate  decreased activity at the mid/apical inferior segments and the mid  inferolateral segment and the apical lateral segment. Wall motion  assessment reveals mild hypokinesis. The ejection fraction is 59%.  IMPRESSION:  This study is abnormal. This is a low risk scan. There is a  medium-sized scar with mild peri-infarct ischemia affecting  primarily the inferior wall.    I have carefully reviewed her symptoms at presentation. She has a very strong family history of CAD. Symptoms have had both typical and atypical features, but with an abnormal stress test showing  medium scar and peri-infarct ischemia I think cardiac catheterization and possible PCI are indicated. Logistically, it is Friday afternoon and the patient just ate lunch. As she is having no resting pain and has ruled out for MI with serial troponins, I think she can be discharged home and return early next week for cardiac catheterization. I have reviewed the risks, indications, and alternatives to this approach. The patient understands and agrees to proceed. She will call 911 or come back to the hospital if chest pain recurs. She is on aspirin and a statin drug. Will add a beta blocker.  [name], M.D. 06/21/2013 1:51 PM

## 2013-06-21 NOTE — Discharge Summary (Signed)
Patient ID: Patricia Chambers,  MRN: [id number], DOB/AGE: [birth year] 64 y.o.  Admit date: 06/20/2013 Discharge date: 06/21/2013  Primary Care Provider: Wynelle Fanny Primary Cardiologist: Dorris Carnes, MD   Discharge Diagnoses Principal Problem:   Chest pain with moderate risk of acute coronary syndrome Active Problems:   HTN (hypertension)   Dyslipidemia   Family history of coronary artery disease    Procedures: Exercise Myoview 06/21/13   Hospital Course: 70F with history of HTN and HLD and remote history of smoking admitted with chest pai 06/20/13. She had just seen Dr Harrington Challenger 06/14/13 for chest pain and was to have an exercise Myoview next week. She presented to the ER with recurrent chest pain yesterday afternoon. Her Troponin were negative and she was set up for an exercise Myioview which was done 06/21/13. Findings listed below-  FINDINGS:  The patient walked on the treadmill for 6 minutes. The study was  stopped for fatigue. There was no significant ST change. Peak heart  rate was 156, above 85% predicted maximum heart rate. There was no  chest pain. There was no significant EKG change. The raw nuclear  data reveals no excess motion. The tomographic images with stress  reveals a medium-sized defect with a moderate decrease activity at  the base/mid/apical inferior segments and the mid inferolateral  segment and the apical lateral segment. There is mild reversibility.  The image at rest reveals a medium-sized defect with moderate  decreased activity at the mid/apical inferior segments and the mid  inferolateral segment and the apical lateral segment. Wall motion  assessment reveals mild hypokinesis. The ejection fraction is 59%.  IMPRESSION:  This study is abnormal. This is a low risk scan. There is a  medium-sized scar with mild peri-infarct ischemia affecting  primarily the inferior wall.   Dr Burt Knack reviewed her study and evaluated the pt. He feels she can be  discharged this pm and return Monday am for an OP cath. We have added a beta blocker, statin, ASA, and prn NTG. She will call 911 or come back to the hospital if chest pain recurs.       Discharge Vitals:  Blood pressure 128/69, pulse 88, temperature 98.4 F (36.9 C), temperature source Oral, resp. rate 18, height $RemoveBe'5\' 7"'[id number]$  (1.702 m), weight 187 lb 3.2 oz (84.913 kg), SpO2 96.00%.    Labs: Results for orders placed during the hospital encounter of 06/20/13 (from the past 48 hour(s))  CBC     Status: None   Collection Time    06/20/13  3:56 PM      Result Value Ref Range   WBC 6.7  4.0 - 10.5 K/uL   RBC 4.40  3.87 - 5.11 MIL/uL   Hemoglobin 13.5  12.0 - 15.0 g/dL   HCT 40.1  36.0 - 46.0 %   MCV 91.1  78.0 - 100.0 fL   MCH 30.7  26.0 - 34.0 pg   MCHC 33.7  30.0 - 36.0 g/dL   RDW 13.0  11.5 - 15.5 %   Platelets 222  150 - 400 K/uL  COMPREHENSIVE METABOLIC PANEL     Status: Abnormal   Collection Time    06/20/13  3:56 PM      Result Value Ref Range   Sodium 139  137 - 147 mEq/L   Potassium 3.4 (*) 3.7 - 5.3 mEq/L   Chloride 99  96 - 112 mEq/L   CO2 24  19 - 32 mEq/L   Glucose, Bld 108 (*)  70 - 99 mg/dL   BUN 19  6 - 23 mg/dL   Creatinine, Ser 0.99  0.50 - 1.10 mg/dL   Calcium 9.6  8.4 - 10.5 mg/dL   Total Protein 7.2  6.0 - 8.3 g/dL   Albumin 4.4  3.5 - 5.2 g/dL   AST 25  0 - 37 U/L   ALT 23  0 - 35 U/L   Alkaline Phosphatase 51  39 - 117 U/L   Total Bilirubin 0.6  0.3 - 1.2 mg/dL   GFR calc non Af Amer 59 (*) >90 mL/min   GFR calc Af Amer 69 (*) >90 mL/min   Comment: (NOTE)     The eGFR has been calculated using the CKD EPI equation.     This calculation has not been validated in all clinical situations.     eGFR's persistently <90 mL/min signify possible Chronic Kidney     Disease.  Randolm Idol, ED     Status: None   Collection Time    06/20/13  4:01 PM      Result Value Ref Range   Troponin i, poc 0.00  0.00 - 0.08 ng/mL   Comment 3            Comment: Due to  the release kinetics of cTnI,     a negative result within the first hours     of the onset of symptoms does not rule out     myocardial infarction with certainty.     If myocardial infarction is still suspected,     repeat the test at appropriate intervals.  TROPONIN I     Status: None   Collection Time    06/21/13 12:41 AM      Result Value Ref Range   Troponin I <0.30  <0.30 ng/mL   Comment:            Due to the release kinetics of cTnI,     a negative result within the first hours     of the onset of symptoms does not rule out     myocardial infarction with certainty.     If myocardial infarction is still suspected,     repeat the test at appropriate intervals.  CREATININE, SERUM     Status: Abnormal   Collection Time    06/21/13 12:41 AM      Result Value Ref Range   Creatinine, Ser 0.84  0.50 - 1.10 mg/dL   GFR calc non Af Amer 72 (*) >90 mL/min   GFR calc Af Amer 84 (*) >90 mL/min   Comment: (NOTE)     The eGFR has been calculated using the CKD EPI equation.     This calculation has not been validated in all clinical situations.     eGFR's persistently <90 mL/min signify possible Chronic Kidney     Disease.  CBC     Status: None   Collection Time    06/21/13 12:41 AM      Result Value Ref Range   WBC 5.4  4.0 - 10.5 K/uL   RBC 4.23  3.87 - 5.11 MIL/uL   Hemoglobin 13.2  12.0 - 15.0 g/dL   HCT 38.9  36.0 - 46.0 %   MCV 92.0  78.0 - 100.0 fL   MCH 31.2  26.0 - 34.0 pg   MCHC 33.9  30.0 - 36.0 g/dL   RDW 13.2  11.5 - 15.5 %   Platelets 188  150 - 400  K/uL  TROPONIN I     Status: None   Collection Time    06/21/13  4:37 AM      Result Value Ref Range   Troponin I <0.30  <0.30 ng/mL   Comment:            Due to the release kinetics of cTnI,     a negative result within the first hours     of the onset of symptoms does not rule out     myocardial infarction with certainty.     If myocardial infarction is still suspected,     repeat the test at appropriate  intervals.  LIPID PANEL     Status: Abnormal   Collection Time    06/21/13  4:37 AM      Result Value Ref Range   Cholesterol 208 (*) 0 - 200 mg/dL   Triglycerides 956 (*) <150 mg/dL   HDL 47  >67 mg/dL   Total CHOL/HDL Ratio 4.4     VLDL 42 (*) 0 - 40 mg/dL   LDL Cholesterol 177 (*) 0 - 99 mg/dL   Comment:            Total Cholesterol/HDL:CHD Risk     Coronary Heart Disease Risk Table                         Men   Women      1/2 Average Risk   3.4   3.3      Average Risk       5.0   4.4      2 X Average Risk   9.6   7.1      3 X Average Risk  23.4   11.0                Use the calculated Patient Ratio     above and the CHD Risk Table     to determine the patient's CHD Risk.                ATP III CLASSIFICATION (LDL):      <100     mg/dL   Optimal      956-462  mg/dL   Near or Above                        Optimal      130-159  mg/dL   Borderline      900-944  mg/dL   High      >615     mg/dL   Very High  TROPONIN I     Status: None   Collection Time    06/21/13 10:55 AM      Result Value Ref Range   Troponin I <0.30  <0.30 ng/mL   Comment:            Due to the release kinetics of cTnI,     a negative result within the first hours     of the onset of symptoms does not rule out     myocardial infarction with certainty.     If myocardial infarction is still suspected,     repeat the test at appropriate intervals.    Disposition:      Follow-up Information   Follow up with Tonny Bollman, MD On 06/24/2013. (Monday 10 am for cath. Nothing to eat after midnight Sunday. OK to take medications Monday with sip  of water)    Specialty:  Cardiology   Contact information:   [address] [address] [phone]       Discharge Medications:    Medication List         aspirin EC 81 MG tablet  Take 81 mg by mouth daily.     atorvastatin 80 MG tablet  Commonly known as:  LIPITOR  Take 1 tablet (80 mg total) by mouth daily at 6 PM.      hydrochlorothiazide 12.5 MG capsule  Commonly known as:  MICROZIDE  Take 12.5 mg by mouth daily.     metoprolol tartrate 25 MG tablet  Commonly known as:  LOPRESSOR  Take 1 tablet (25 mg total) by mouth 2 (two) times daily.     multivitamin per tablet  Take 1 tablet by mouth daily.     nitroGLYCERIN 0.4 MG SL tablet  Commonly known as:  NITROSTAT  Place 1 tablet (0.4 mg total) under the tongue every 5 (five) minutes as needed for chest pain.     vitamin C 500 MG tablet  Commonly known as:  ASCORBIC ACID  Take 500 mg by mouth daily.         Duration of Discharge Encounter: Greater than 30 minutes including physician time.  Angelena Form PA-C 06/21/2013 2:26 PM

## 2013-06-21 NOTE — Discharge Instructions (Signed)
Angina Pectoris  Angina pectoris, often just called angina, is extreme discomfort in your chest, neck, or arm caused by a lack of blood in the middle and thickest layer of your heart wall (myocardium). It may feel like tightness or heavy pressure. It may feel like a crushing or squeezing pain. Some people say it feels like gas or indigestion. It may go down your shoulders, back, and arms. Some people may have symptoms other than pain. These symptoms include fatigue, shortness of breath, cold sweats, or nausea. There are four different types of angina:   Stable angina Stable angina usually occurs in episodes of predictable frequency and duration. It usually is brought on by physical activity, emotional stress, or excitement. These are all times when the myocardium needs more oxygen. Stable angina usually lasts a few minutes and often is relieved by taking a medicine that can be taken under your tongue (sublingually). The medicine is called nitroglycerin. Stable angina is caused by a buildup of plaque inside the arteries, which restricts blood flow to the heart muscle (atherosclerosis).   Unstable angina Unstable angina can occur even when your body experiences little or no physical exertion. It can occur during sleep. It can also occur at rest. It can suddenly increase in severity or frequency. It might not be relieved by sublingual nitroglycerin. It can last up to 30 minutes. The most common cause of unstable angina is a blood clot that has developed on the top of plaque buildup inside a coronary artery. It can lead to a heart attack if the blood clot completely blocks the artery.   Microvascular angina This type of angina is caused by a disorder of tiny blood vessels called arterioles. Microvascular angina is more common in women. The pain may be more severe and last longer than other types of angina pectoris.   Prinzmetal or variant angina This type of angina pectoris usually occurs when your body experiences  little or no physical exertion. It especially occurs in the early morning hours. It is caused by a spasm of your coronary artery.  HOME CARE INSTRUCTIONS    Only take over-the-counter and prescription medicines as directed by your caregiver.   Stay active or increase your exercise as directed by your caregiver.   Limit strenuous activity as directed by your caregiver.   Limit heavy lifting as directed by your caregiver.   Maintain a healthy weight.   Learn about and eat heart-healthy foods.   Do not smoke.  SEEK IMMEDIATE MEDICAL CARE IF:   You experience the following symptoms:   Chest, neck, deep shoulder, or arm pain or discomfort that lasts more than a few minutes.   Chest, neck, deep shoulder, or arm pain or discomfort that goes away and comes back, repeatedly.   Heavy sweating with discomfort, without a noticeable cause.   Shortness of breath or difficulty breathing.   Angina that does not get better after a few minutes of rest or after taking sublingual nitroglycerin.  These can all be symptoms of a heart attack, which is a medical emergency! Get medical help at once. Call your local emergency service (911 in [place] immediately. Do not  drive yourself to the hospital and do not  wait to for your symptoms to go away.  MAKE SURE YOU:   Understand these instructions.   Will watch your condition.   Will get help right away if you are not doing well or get worse.  Document Released: 03/14/2005 Document Revised: 02/29/2012 Document Reviewed: 

## 2013-06-23 ENCOUNTER — Other Ambulatory Visit: Payer: Self-pay | Admitting: Cardiovascular Disease

## 2013-06-23 DIAGNOSIS — R079 Chest pain, unspecified: Secondary | ICD-10-CM

## 2013-06-24 ENCOUNTER — Encounter (HOSPITAL_COMMUNITY)
Admission: RE | Disposition: A | Discharge status: Home / Self Care | Payer: Self-pay | Source: Ambulatory Visit | Attending: Cardiovascular Disease

## 2013-06-24 ENCOUNTER — Ambulatory Visit (HOSPITAL_COMMUNITY)
Admission: RE | Admit: 2013-06-24 | Discharge: 2013-06-24 | Disposition: A | Discharge status: Home / Self Care | Payer: BC Managed Care – PPO | Source: Ambulatory Visit | Attending: Cardiovascular Disease | Admitting: Cardiovascular Disease

## 2013-06-24 DIAGNOSIS — R079 Chest pain, unspecified: Secondary | ICD-10-CM | POA: Insufficient documentation

## 2013-06-24 DIAGNOSIS — J45909 Unspecified asthma, uncomplicated: Secondary | ICD-10-CM | POA: Insufficient documentation

## 2013-06-24 DIAGNOSIS — I10 Essential (primary) hypertension: Secondary | ICD-10-CM | POA: Insufficient documentation

## 2013-06-24 DIAGNOSIS — Z87891 Personal history of nicotine dependence: Secondary | ICD-10-CM | POA: Insufficient documentation

## 2013-06-24 DIAGNOSIS — E785 Hyperlipidemia, unspecified: Secondary | ICD-10-CM | POA: Insufficient documentation

## 2013-06-24 DIAGNOSIS — Z8249 Family history of ischemic heart disease and other diseases of the circulatory system: Secondary | ICD-10-CM | POA: Insufficient documentation

## 2013-06-24 HISTORY — PX: LEFT HEART CATHETERIZATION WITH CORONARY ANGIOGRAM: SHX5451

## 2013-06-24 LAB — PROTIME-INR
INR: 1.01 (ref 0.00–1.49)
Prothrombin Time: 13.1 seconds (ref 11.6–15.2)

## 2013-06-24 LAB — POTASSIUM: Potassium: 4.1 mEq/L (ref 3.7–5.3)

## 2013-06-24 SURGERY — LEFT HEART CATHETERIZATION WITH CORONARY ANGIOGRAM
Anesthesia: LOCAL

## 2013-06-24 MED ORDER — ONDANSETRON HCL 4 MG/2ML IJ SOLN: 4.0000 mg | Freq: Four times a day (QID) | INTRAMUSCULAR | Status: DC | PRN

## 2013-06-24 MED ORDER — SODIUM CHLORIDE 0.9 % IV SOLN
INTRAVENOUS | Status: DC
  Administered 2013-06-24: 11:00:00 via INTRAVENOUS

## 2013-06-24 MED ORDER — FENTANYL CITRATE 0.05 MG/ML IJ SOLN
INTRAMUSCULAR | Status: AC
  Filled 2013-06-24: qty 2

## 2013-06-24 MED ORDER — HEPARIN SODIUM (PORCINE) 1000 UNIT/ML IJ SOLN
INTRAMUSCULAR | Status: AC
  Filled 2013-06-24: qty 1

## 2013-06-24 MED ORDER — ASPIRIN 81 MG PO CHEW
CHEWABLE_TABLET | ORAL | Status: AC
  Administered 2013-06-24: 81 mg via ORAL
  Filled 2013-06-24: qty 1

## 2013-06-24 MED ORDER — ASPIRIN 81 MG PO CHEW
81.0000 mg | CHEWABLE_TABLET | ORAL | Status: AC
  Administered 2013-06-24: 81 mg via ORAL

## 2013-06-24 MED ORDER — DIAZEPAM 5 MG PO TABS
5.0000 mg | ORAL_TABLET | ORAL | Status: AC
  Administered 2013-06-24: 5 mg via ORAL
  Filled 2013-06-24: qty 1

## 2013-06-24 MED ORDER — ACETAMINOPHEN 325 MG PO TABS: 650.0000 mg | ORAL_TABLET | ORAL | Status: DC | PRN

## 2013-06-24 MED ORDER — VERAPAMIL HCL 2.5 MG/ML IV SOLN
INTRAVENOUS | Status: AC
  Filled 2013-06-24: qty 2

## 2013-06-24 MED ORDER — SODIUM CHLORIDE 0.9 % IJ SOLN: 3.0000 mL | Freq: Two times a day (BID) | INTRAMUSCULAR | Status: DC

## 2013-06-24 MED ORDER — MIDAZOLAM HCL 2 MG/2ML IJ SOLN
INTRAMUSCULAR | Status: AC
  Filled 2013-06-24: qty 2

## 2013-06-24 MED ORDER — SODIUM CHLORIDE 0.9 % IJ SOLN: 3.0000 mL | INTRAMUSCULAR | Status: DC | PRN

## 2013-06-24 MED ORDER — SODIUM CHLORIDE 0.9 % IV SOLN: 1.0000 mL/kg/h | INTRAVENOUS | Status: DC

## 2013-06-24 MED ORDER — SODIUM CHLORIDE 0.9 % IV SOLN: 250.0000 mL | INTRAVENOUS | Status: DC | PRN

## 2013-06-24 NOTE — CV Procedure (Signed)
Cardiac Catheterization Procedure Note   Name: Patricia Chambers MRN: [id number] DOB: 01/12/1951  Procedure: Left Heart Cath, Selective Coronary Angiography, LV angiography   Indication: Chest pain, abnormal Myoview scan.   Procedural Details: The right wrist was prepped, draped, and anesthetized with 1% lidocaine. Using the modified Seldinger technique, a 5 French sheath was introduced into the right radial artery. 3 mg of verapamil was administered through the sheath, weight-based unfractionated heparin was administered intravenously. Standard Judkins catheters were used for selective coronary angiography and left ventriculography. Catheter exchanges were performed over an exchange length guidewire. There were no immediate procedural complications. A TR band was used for radial hemostasis at the completion of the procedure. The patient was transferred to the post catheterization recovery area for further monitoring.   Procedural Findings:  Hemodynamics:  AO 133/69  LV 136/10   Coronary angiography:  Coronary dominance: right   Left mainstem: The left main is widely patent it divides into the LAD and left circumflex.   Left anterior descending (LAD): The LAD is widely patent throughout. The proximal vessel is large in caliber with no stenosis. The first diagonal is normal in caliber with no stenosis. The LAD tapers after the first diagonal branch and has no significant stenosis throughout its course.   Left circumflex (LCx): The left circumflex is patent and supplies the OM branch and left posterolateral branch.   Right coronary artery (RCA): Dominant vessel, no significant stenosis throughout, PDA and PLA branches are patent.   Left ventriculography: Left ventricular systolic function is normal, LVEF is estimated at 55-60%, there is no significant mitral regurgitation   Final Conclusions:  1. Patent coronary arteries without evidence of coronary obstruction  2. Normal LV systolic function   Recommendations: Reassurance. Suspect noncardiac chest pain.   [name]  06/24/2013, 1:54 PM

## 2013-06-24 NOTE — Discharge Instructions (Signed)

## 2013-06-24 NOTE — H&P (View-Only) (Signed)
Subjective:  No chest pain overnight  Objective:  Vital Signs in the last 24 hours: Temp:  [97.7 F (36.5 C)-98.9 F (37.2 C)] 98.4 F (36.9 C) (03/27 0545) Pulse Rate:  [68-110] 82 (03/27 0847) Resp:  [16-18] 16 (03/27 0847) BP: (125-164)/(71-90) 155/90 mmHg (03/27 0847) SpO2:  [97 %-100 %] 99 % (03/27 0545) Weight:  [187 lb 3.2 oz (84.913 kg)-188 lb (85.276 kg)] 187 lb 3.2 oz (84.913 kg) (03/26 2058)  Intake/Output from previous day:  Intake/Output Summary (Last 24 hours) at 06/21/13 0856 Last data filed at 06/21/13 0516  Gross per 24 hour  Intake    240 ml  Output      0 ml  Net    240 ml    Physical Exam: General appearance: alert, cooperative and no distress Lungs: clear to auscultation bilaterally Heart: regular rate and rhythm, S1, S2 normal, no murmur, click, rub or gallop   Rate: 66  Rhythm: normal sinus rhythm  Lab Results:  Recent Labs  06/20/13 1556 06/21/13 0041  WBC 6.7 5.4  HGB 13.5 13.2  PLT 222 188    Recent Labs  06/20/13 1556 06/21/13 0041  NA 139  --   K 3.4*  --   CL 99  --   CO2 24  --   GLUCOSE 108*  --   BUN 19  --   CREATININE 0.99 0.84    Recent Labs  06/21/13 0041 06/21/13 0437  TROPONINI <0.30 <0.30   No results found for this basename: INR,  in the last 72 hours  Imaging: Dg Chest 2 View  06/20/2013   CLINICAL DATA:  Chest pain.  History of hypertension and asthma.  EXAM: CHEST  2 VIEW  COMPARISON:  None.  FINDINGS: The heart size and mediastinal contours are within normal limits. Both lungs are clear. The visualized skeletal structures are unremarkable.  IMPRESSION: No active cardiopulmonary disease.   Electronically Signed   By: Shon Hale M.D.   On: 06/20/2013 16:48    Cardiac Studies:  Assessment/Plan:  76F with history of HTN and HLD and remote history of smoking admitted with chest pain. She had just seen Dr Harrington Challenger 06/14/13 for chest pain and was to have an exercise Myoview next week. She presented to the  ER with recurrent chest pain yesterday afternoon. Troponin negative.     Principal Problem:   Chest pain with moderate risk of acute coronary syndrome Active Problems:   HTN (hypertension)   Dyslipidemia   Family history of coronary artery disease    PLAN: Exercise Myoview  Kerin Ransom PA-C Beeper [phone] 06/21/2013, 8:56 AM  Patient seen, examined. Available data reviewed. Agree with findings, assessment, and plan as outlined by Kerin Ransom, PA-C. Exam reveals an alert, oriented woman in no distress. Lungs are clear. Heart is regular rate and rhythm without murmur or gallop. Abdomen is soft nontender. Extremities show no edema.  The patient has been chest pain-free since admission. Her Myoview is reviewed as below:  FINDINGS:  The patient walked on the treadmill for 6 minutes. The study was  stopped for fatigue. There was no significant ST change. Peak heart  rate was 156, above 85% predicted maximum heart rate. There was no  chest pain. There was no significant EKG change. The raw nuclear  data reveals no excess motion. The tomographic images with stress  reveals a medium-sized defect with a moderate decrease activity at  the base/mid/apical inferior segments and the mid inferolateral  segment  and the apical lateral segment. There is mild reversibility.  The image at rest reveals a medium-sized defect with moderate  decreased activity at the mid/apical inferior segments and the mid  inferolateral segment and the apical lateral segment. Wall motion  assessment reveals mild hypokinesis. The ejection fraction is 59%.  IMPRESSION:  This study is abnormal. This is a low risk scan. There is a  medium-sized scar with mild peri-infarct ischemia affecting  primarily the inferior wall.    I have carefully reviewed her symptoms at presentation. She has a very strong family history of CAD. Symptoms have had both typical and atypical features, but with an abnormal stress test showing  medium scar and peri-infarct ischemia I think cardiac catheterization and possible PCI are indicated. Logistically, it is Friday afternoon and the patient just ate lunch. As she is having no resting pain and has ruled out for MI with serial troponins, I think she can be discharged home and return early next week for cardiac catheterization. I have reviewed the risks, indications, and alternatives to this approach. The patient understands and agrees to proceed. She will call 911 or come back to the hospital if chest pain recurs. She is on aspirin and a statin drug. Will add a beta blocker.  [name], M.D. 06/21/2013 1:51 PM

## 2013-06-24 NOTE — Interval H&P Note (Signed)
History and Physical Interval Note:  06/24/2013 1:31 PM  Patricia Chambers  has presented today for surgery, with the diagnosis of CP  The various methods of treatment have been discussed with the patient and family. After consideration of risks, benefits and other options for treatment, the patient has consented to  Procedure(s): LEFT HEART CATHETERIZATION WITH CORONARY ANGIOGRAM (N/A) as a surgical intervention .  The patient's history has been reviewed, patient examined, no change in status, stable for surgery.  I have reviewed the patient's chart and labs.  Questions were answered to the patient's satisfaction.    Cath Lab Visit (complete for each Cath Lab visit)  Clinical Evaluation Leading to the Procedure:   ACS: no  Non-ACS:    Anginal Classification: CCS III  Anti-ischemic medical therapy: Minimal Therapy (1 class of medications)  Non-Invasive Test Results: Intermediate-risk stress test findings: cardiac mortality 1-3%/year  Prior CABG: No previous CABG       Patricia Chambers

## 2013-06-24 NOTE — Progress Notes (Signed)
RIGHT WRIST SWOLLEN AT TR BAND AND CATH LAB STAFF HELD PRESSURE AT SITE AND SITE SOFT AFTER PRESSURE HELD

## 2013-06-26 ENCOUNTER — Encounter (HOSPITAL_COMMUNITY): Payer: BC Managed Care – PPO

## 2013-10-15 ENCOUNTER — Other Ambulatory Visit: Payer: Self-pay

## 2013-10-15 DIAGNOSIS — Z1231 Encounter for screening mammogram for malignant neoplasm of breast: Secondary | ICD-10-CM

## 2013-11-05 ENCOUNTER — Ambulatory Visit
Admission: RE | Admit: 2013-11-05 | Discharge: 2013-11-05 | Disposition: A | Discharge status: Home / Self Care | Payer: BC Managed Care – PPO | Source: Ambulatory Visit

## 2013-11-05 ENCOUNTER — Encounter (INDEPENDENT_AMBULATORY_CARE_PROVIDER_SITE_OTHER): Payer: Self-pay

## 2013-11-05 DIAGNOSIS — Z1231 Encounter for screening mammogram for malignant neoplasm of breast: Secondary | ICD-10-CM

## 2014-03-06 ENCOUNTER — Encounter (HOSPITAL_COMMUNITY): Payer: Self-pay | Admitting: Cardiovascular Disease

## 2014-07-04 ENCOUNTER — Other Ambulatory Visit: Payer: Self-pay | Admitting: Cardiovascular Disease

## 2014-08-08 ENCOUNTER — Other Ambulatory Visit: Payer: Self-pay | Admitting: Family Medicine

## 2014-08-08 ENCOUNTER — Ambulatory Visit
Admission: RE | Admit: 2014-08-08 | Discharge: 2014-08-08 | Disposition: A | Discharge status: Home / Self Care | Payer: BLUE CROSS/BLUE SHIELD | Source: Ambulatory Visit | Attending: Family Medicine | Admitting: Family Medicine

## 2014-08-08 DIAGNOSIS — M25552 Pain in left hip: Secondary | ICD-10-CM

## 2014-08-19 ENCOUNTER — Ambulatory Visit: Payer: BLUE CROSS/BLUE SHIELD | Attending: Family Medicine

## 2014-08-19 DIAGNOSIS — M25552 Pain in left hip: Secondary | ICD-10-CM | POA: Diagnosis not present

## 2014-08-19 DIAGNOSIS — R29898 Other symptoms and signs involving the musculoskeletal system: Secondary | ICD-10-CM | POA: Insufficient documentation

## 2014-08-19 DIAGNOSIS — M533 Sacrococcygeal disorders, not elsewhere classified: Secondary | ICD-10-CM | POA: Insufficient documentation

## 2014-08-19 NOTE — Therapy (Addendum)
[facility] [address], [place], [place], [address] Phone: [phone]   Fax:  [phone]  Physical Therapy Evaluation  Patient Details  Name: Patricia Chambers MRN: [id number] Date of Birth: September 26, 1950 Referring Provider:  Carlos Levering, PA-C  Encounter Date: 08/19/2014      PT End of Session - 08/19/14 1431    Visit Number 1   Number of Visits --  Medicare   Date for PT Re-Evaluation 10/14/14   PT Start Time 1229   PT Stop Time 1312   PT Time Calculation (min) 43 min   Activity Tolerance Patient tolerated treatment well   Behavior During Therapy [facility] for tasks assessed/performed      Past Medical History  Diagnosis Date  . Asthma   . FHx: migraine headaches   . Hx of colonic polyps   . Hypertension   . Sarcoidosis   . Complication of anesthesia     hard to wake up  . PONV (postoperative nausea and vomiting)     Past Surgical History  Procedure Laterality Date  . Temporomandibular joint surgery    . Left heart catheterization with coronary angiogram N/A 06/24/2013    Procedure: LEFT HEART CATHETERIZATION WITH CORONARY ANGIOGRAM;  Surgeon: Blane Ohara, MD;  Location: [facility] CATH LAB;  Service: Cardiovascular;  Laterality: N/A;    There were no vitals filed for this visit.  Visit Diagnosis:  Hip pain, left - Plan: PT plan of care cert/re-cert  Weakness of left hip - Plan: PT plan of care cert/re-cert  Sacroiliac joint pain - Plan: PT plan of care cert/re-cert      Subjective Assessment - 08/19/14 1234    Subjective Has had Lt. hip pain for the past 3 years; increased in pain this past December.    How long can you sit comfortably? 10 minutes sometimes; more stiffness with standing up after sitting for a long period of time    How long can you stand comfortably? 1 hour to do cooking activities; not limited in standing    How long can you walk comfortably? Able to walk 30 minutes - hour but will  feel hip pain/soreness afterward; would like to walk for exercise everyday    Patient Stated Goals Safe exercise progression to return to full exercise program (1 hour walking each day)    Currently in Pain? No/denies   Pain Score 8    Pain Location Hip   Pain Orientation Left   Pain Descriptors / Indicators Shooting   Pain Type Chronic pain   Pain Onset More than a month ago   Pain Frequency Intermittent   Aggravating Factors  Walking for exercise (30 minutes - 1 hour at increased velocity), sitting    Pain Relieving Factors Change positions,             [facility] PT Assessment - 08/19/14 0001    Assessment   Medical Diagnosis M25.552 - Left Hip Pain   Onset Date/Surgical Date 02/26/14  Initial onset in the past 3 years    Next MD Visit 10/2014  Planning on see orthopedic after August   Prior Therapy No   Precautions   Precautions None   Restrictions   Weight Bearing Restrictions No   Balance Screen   Has the patient fallen in the past 6 months No   Has the patient had a decrease in activity level because of a fear of falling?  Yes   Is the patient reluctant to leave  their home because of a fear of falling?  No   Home Environment   Living Environment Private residence   Living Arrangements Spouse/significant other   Type of [place] to enter   Entrance Stairs-Number of Steps [place] One level   [place] None  Husband has walker, cane, shower seat   Prior Function   Level of Independence Independent   Vocation Retired   Associate Professor   Overall Cognitive Status Within Functional Limits for tasks assessed   Observation/Other Assessments   Focus on Therapeutic Outcomes (FOTO)  35%   Functional Tests   Functional tests Other   Posture/Postural Control   Posture/Postural Control Postural limitations   Posture Comments Lt ASIS higher than Rt in standing   ROM / Strength   AROM / PROM / Strength AROM;PROM;Strength   AROM   Overall AROM   Within functional limits for tasks performed  Full motion for hip flexion, IR/ER    PROM   Overall PROM  Within functional limits for tasks performed  Normal motion for Bil hip IR/ER   Strength   Overall Strength Deficits   Overall Strength Comments Bil hip flexion: 4/5; knee/ankle strength 5/5    Flexibility   Soft Tissue Assessment /Muscle Length yes   Hamstrings WNL   Piriformis FABER test reproduced muscle tightness on the Lt                           PT Education - 08/19/14 1309    Education provided Yes   Education Details HEP: single knee to chest, diagnoal knee to chest   Person(s) Educated Patient   Methods Explanation;Demonstration;Handout   Comprehension Verbalized understanding;Returned demonstration          PT Short Term Goals - 08/19/14 1439    PT SHORT TERM GOAL #1   Title Will be independent with initial HEP    Time 4   Period Weeks   Status New   PT SHORT TERM GOAL #2   Title Will report 25% decrease in Lt hip pain with daily walking activity   Time 4   Period Weeks   Status New           PT Long Term Goals - 08/19/14 1440    PT LONG TERM GOAL #1   Title Will be indepedent with ongoing HEP   Time 8   Period Weeks   Status New   PT LONG TERM GOAL #2   Title Pt will demonsrate knowledge of independent gym program for fitness    Time 8   Period Weeks   Status New   PT LONG TERM GOAL #3   Title Bil. hip flexion strength 5/5 in order to improve participation in walking for exercise   Time 8   Period Weeks   Status New   PT LONG TERM GOAL #4   Title report a 60% reduction in Lt hip/SI joint pain after walking for exercise   Time 8   Period Weeks   Status New               Plan - 08/19/14 1432    Clinical Impression Statement 64 y.o female presents with Lt hip pain that increases with long periods of sitting and after walking 30+ minutes of vigorous exercise. She has had intermittent bouts of Lt hip pain for the  past 3 years, but has noticed pain  with walking activity within the past 3 months. (+) for FABER, SI compression test and Lt ASIS is higher than right, indicating potential SI involvement/R anterior pelvic rotation and muscle tightness. She also presents with Bil hip flexion weakness. Pt will benefit from skilled PT for flexbility, strength and endurance training to reduce pain during exercise program.      Pt will benefit from skilled therapeutic intervention in order to improve on the following deficits Pain;Decreased mobility;Increased muscle spasms;Postural dysfunction;Decreased strength;Decreased activity tolerance;Decreased endurance;Impaired flexibility;Difficulty walking   Rehab Potential Good   PT Frequency 2x / week   PT Duration 8 weeks   PT Treatment/Interventions ADLs/Self Care Home Management;Stair training;Functional mobility training;Patient/family education;Therapeutic activities;Moist Heat;Ultrasound;Therapeutic exercise;Passive range of motion;Cryotherapy;Electrical Stimulation;Gait training;Neuromuscular re-education   PT Next Visit Plan Begin with hip strengthening exercises, ask about heel lift, check ITB tightness/piriformis palpation, analyze gait, check hip abduction strength   Consulted and Agree with Plan of Care Patient          G-Codes - 09-06-2014 1316    Functional Assessment Tool Used FOTO: 35% limitation   Functional Limitation Mobility: Walking and moving around   Mobility: Walking and Moving Around Current Status [phone]) At least 20 percent but less than 40 percent impaired, limited or restricted   Mobility: Walking and Moving Around Goal Status [phone]) At least 20 percent but less than 40 percent impaired, limited or restricted       Problem List Patient Active Problem List   Diagnosis Date Noted  . HTN (hypertension) 06/21/2013  . Dyslipidemia 06/21/2013  . Family history of coronary artery disease 06/21/2013  . Chest pain with moderate risk of acute  coronary syndrome 06/20/2013    Reginal Lutes, SPT Sep 06, 2014 4:32 PM   G-codes: CJ discharge status Mobility: walking and moving around. PHYSICAL THERAPY DISCHARGE SUMMARY  Visits from Start of Care:1  Current functional level related to goals / functional outcomes: Pt attended PT for evaluation only.     Remaining deficits: See above.  Pt hasn't returned since evaluation.   Education / Equipment: HEP Plan: Patient agrees to discharge.  Patient goals were not met. Patient is being discharged due to not returning since the last visit.  ?????    Sigurd Sos, PT 10/16/2014 12:18 PM   [facility] [address], [place], [place], [address] Phone: [phone]   Fax:  [phone]

## 2014-08-19 NOTE — Patient Instructions (Signed)
  Stretching: Piriformis (Supine)  Pull right knee toward opposite shoulder. Hold _20___ seconds. Relax. Repeat __3__ times per set. Do __1__ sets per session. Do __3__ sessions per day.  HIP: Hamstrings - Short Sitting   Rest leg on raised surface. Keep knee straight. Lift chest. Hold _20__ seconds. _3__ reps per set, 1___ sets per day  Copyright  VHI. All rights reserved.   Knee to Chest   Lying supine, bend involved knee to chest, hold 20 seconds, repeat _3__ times. Repeat with other leg. Do _3__ times per day.  Copyright  VHI. All rights reserved.

## 2014-08-20 ENCOUNTER — Other Ambulatory Visit: Payer: Self-pay | Admitting: Cardiovascular Disease

## 2014-11-06 ENCOUNTER — Other Ambulatory Visit: Payer: Self-pay

## 2014-11-06 DIAGNOSIS — Z1231 Encounter for screening mammogram for malignant neoplasm of breast: Secondary | ICD-10-CM

## 2014-12-17 ENCOUNTER — Ambulatory Visit
Admission: RE | Admit: 2014-12-17 | Discharge: 2014-12-17 | Disposition: A | Discharge status: Home / Self Care | Payer: BLUE CROSS/BLUE SHIELD | Source: Ambulatory Visit

## 2014-12-17 DIAGNOSIS — Z1231 Encounter for screening mammogram for malignant neoplasm of breast: Secondary | ICD-10-CM

## 2015-08-19 DIAGNOSIS — Z0000 Encounter for general adult medical examination without abnormal findings: Secondary | ICD-10-CM | POA: Diagnosis not present

## 2015-08-19 DIAGNOSIS — R7301 Impaired fasting glucose: Secondary | ICD-10-CM | POA: Diagnosis not present

## 2015-08-19 DIAGNOSIS — E785 Hyperlipidemia, unspecified: Secondary | ICD-10-CM | POA: Diagnosis not present

## 2015-08-19 DIAGNOSIS — Z23 Encounter for immunization: Secondary | ICD-10-CM | POA: Diagnosis not present

## 2015-08-19 DIAGNOSIS — I10 Essential (primary) hypertension: Secondary | ICD-10-CM | POA: Diagnosis not present

## 2015-09-03 DIAGNOSIS — L814 Other melanin hyperpigmentation: Secondary | ICD-10-CM | POA: Diagnosis not present

## 2015-09-03 DIAGNOSIS — L918 Other hypertrophic disorders of the skin: Secondary | ICD-10-CM | POA: Diagnosis not present

## 2015-09-03 DIAGNOSIS — D225 Melanocytic nevi of trunk: Secondary | ICD-10-CM | POA: Diagnosis not present

## 2015-09-03 DIAGNOSIS — L820 Inflamed seborrheic keratosis: Secondary | ICD-10-CM | POA: Diagnosis not present

## 2015-09-16 DIAGNOSIS — M8588 Other specified disorders of bone density and structure, other site: Secondary | ICD-10-CM | POA: Diagnosis not present

## 2015-09-16 DIAGNOSIS — E2839 Other primary ovarian failure: Secondary | ICD-10-CM | POA: Diagnosis not present

## 2015-11-12 ENCOUNTER — Other Ambulatory Visit: Payer: Self-pay | Admitting: Family Medicine

## 2015-11-12 DIAGNOSIS — Z1231 Encounter for screening mammogram for malignant neoplasm of breast: Secondary | ICD-10-CM

## 2015-11-18 DIAGNOSIS — H524 Presbyopia: Secondary | ICD-10-CM | POA: Diagnosis not present

## 2015-11-18 DIAGNOSIS — H2513 Age-related nuclear cataract, bilateral: Secondary | ICD-10-CM | POA: Diagnosis not present

## 2015-11-18 DIAGNOSIS — H04123 Dry eye syndrome of bilateral lacrimal glands: Secondary | ICD-10-CM | POA: Diagnosis not present

## 2015-12-15 DIAGNOSIS — Z23 Encounter for immunization: Secondary | ICD-10-CM | POA: Diagnosis not present

## 2015-12-18 ENCOUNTER — Ambulatory Visit
Admission: RE | Admit: 2015-12-18 | Discharge: 2015-12-18 | Disposition: A | Discharge status: Home / Self Care | Payer: Medicare Other | Source: Ambulatory Visit | Attending: Family Medicine | Admitting: Family Medicine

## 2015-12-18 DIAGNOSIS — Z1231 Encounter for screening mammogram for malignant neoplasm of breast: Secondary | ICD-10-CM | POA: Diagnosis not present

## 2016-08-30 DIAGNOSIS — Z23 Encounter for immunization: Secondary | ICD-10-CM | POA: Diagnosis not present

## 2016-08-30 DIAGNOSIS — K224 Dyskinesia of esophagus: Secondary | ICD-10-CM | POA: Diagnosis not present

## 2016-08-30 DIAGNOSIS — E785 Hyperlipidemia, unspecified: Secondary | ICD-10-CM | POA: Diagnosis not present

## 2016-08-30 DIAGNOSIS — I10 Essential (primary) hypertension: Secondary | ICD-10-CM | POA: Diagnosis not present

## 2016-08-30 DIAGNOSIS — Z0000 Encounter for general adult medical examination without abnormal findings: Secondary | ICD-10-CM | POA: Diagnosis not present

## 2016-08-31 ENCOUNTER — Other Ambulatory Visit: Payer: Self-pay | Admitting: Family Medicine

## 2016-08-31 DIAGNOSIS — N63 Unspecified lump in unspecified breast: Secondary | ICD-10-CM

## 2016-09-06 ENCOUNTER — Ambulatory Visit
Admission: RE | Admit: 2016-09-06 | Discharge: 2016-09-06 | Disposition: A | Discharge status: Home / Self Care | Payer: Medicare Other | Source: Ambulatory Visit | Attending: Family Medicine | Admitting: Family Medicine

## 2016-09-06 DIAGNOSIS — R928 Other abnormal and inconclusive findings on diagnostic imaging of breast: Secondary | ICD-10-CM | POA: Diagnosis not present

## 2016-09-06 DIAGNOSIS — N63 Unspecified lump in unspecified breast: Secondary | ICD-10-CM

## 2016-09-06 DIAGNOSIS — N6489 Other specified disorders of breast: Secondary | ICD-10-CM | POA: Diagnosis not present

## 2016-11-24 DIAGNOSIS — H524 Presbyopia: Secondary | ICD-10-CM | POA: Diagnosis not present

## 2016-11-24 DIAGNOSIS — H2513 Age-related nuclear cataract, bilateral: Secondary | ICD-10-CM | POA: Diagnosis not present

## 2016-11-24 DIAGNOSIS — H04123 Dry eye syndrome of bilateral lacrimal glands: Secondary | ICD-10-CM | POA: Diagnosis not present

## 2016-12-16 ENCOUNTER — Other Ambulatory Visit: Payer: Self-pay | Admitting: Family Medicine

## 2016-12-16 DIAGNOSIS — Z1231 Encounter for screening mammogram for malignant neoplasm of breast: Secondary | ICD-10-CM

## 2016-12-28 ENCOUNTER — Ambulatory Visit
Admission: RE | Admit: 2016-12-28 | Discharge: 2016-12-28 | Disposition: A | Discharge status: Home / Self Care | Payer: Medicare Other | Source: Ambulatory Visit | Attending: Family Medicine | Admitting: Family Medicine

## 2016-12-28 DIAGNOSIS — Z1231 Encounter for screening mammogram for malignant neoplasm of breast: Secondary | ICD-10-CM | POA: Diagnosis not present

## 2017-01-12 DIAGNOSIS — Z23 Encounter for immunization: Secondary | ICD-10-CM | POA: Diagnosis not present

## 2017-02-15 DIAGNOSIS — B078 Other viral warts: Secondary | ICD-10-CM | POA: Diagnosis not present

## 2017-02-15 DIAGNOSIS — L218 Other seborrheic dermatitis: Secondary | ICD-10-CM | POA: Diagnosis not present

## 2017-02-15 DIAGNOSIS — L718 Other rosacea: Secondary | ICD-10-CM | POA: Diagnosis not present

## 2017-05-22 DIAGNOSIS — L308 Other specified dermatitis: Secondary | ICD-10-CM | POA: Diagnosis not present

## 2017-05-22 DIAGNOSIS — L218 Other seborrheic dermatitis: Secondary | ICD-10-CM | POA: Diagnosis not present

## 2017-06-19 DIAGNOSIS — R55 Syncope and collapse: Secondary | ICD-10-CM | POA: Diagnosis not present

## 2017-06-19 DIAGNOSIS — E538 Deficiency of other specified B group vitamins: Secondary | ICD-10-CM | POA: Diagnosis not present

## 2017-06-19 DIAGNOSIS — E611 Iron deficiency: Secondary | ICD-10-CM | POA: Diagnosis not present

## 2017-06-19 DIAGNOSIS — R5382 Chronic fatigue, unspecified: Secondary | ICD-10-CM | POA: Diagnosis not present

## 2017-06-19 DIAGNOSIS — M1990 Unspecified osteoarthritis, unspecified site: Secondary | ICD-10-CM | POA: Diagnosis not present

## 2017-06-19 DIAGNOSIS — R399 Unspecified symptoms and signs involving the genitourinary system: Secondary | ICD-10-CM | POA: Diagnosis not present

## 2017-06-20 ENCOUNTER — Telehealth (HOSPITAL_COMMUNITY): Payer: Self-pay | Admitting: Family Medicine

## 2017-06-20 ENCOUNTER — Other Ambulatory Visit: Payer: Self-pay | Admitting: Family Medicine

## 2017-06-20 DIAGNOSIS — R55 Syncope and collapse: Secondary | ICD-10-CM

## 2017-06-23 ENCOUNTER — Ambulatory Visit (HOSPITAL_COMMUNITY): Payer: Medicare Other | Attending: Cardiovascular Disease

## 2017-06-23 ENCOUNTER — Other Ambulatory Visit: Payer: Self-pay

## 2017-06-23 DIAGNOSIS — R55 Syncope and collapse: Secondary | ICD-10-CM | POA: Insufficient documentation

## 2017-06-23 DIAGNOSIS — I10 Essential (primary) hypertension: Secondary | ICD-10-CM | POA: Insufficient documentation

## 2017-06-23 DIAGNOSIS — D869 Sarcoidosis, unspecified: Secondary | ICD-10-CM | POA: Insufficient documentation

## 2017-06-23 DIAGNOSIS — I351 Nonrheumatic aortic (valve) insufficiency: Secondary | ICD-10-CM | POA: Insufficient documentation

## 2017-06-23 DIAGNOSIS — J45909 Unspecified asthma, uncomplicated: Secondary | ICD-10-CM | POA: Insufficient documentation

## 2017-06-23 NOTE — Telephone Encounter (Signed)
User: Patricia Chambers A Date/time: 06/20/17 2:26 PM  Comment: Called pt and lmsg for her to CB to get sch for an echo.   Context:  Outcome: Left Message  Phone number: [phone] Phone Type: Home Phone  Comm. type: Telephone Call type: Outgoing  Contact: Jodi Mourning, [place] Relation to patient: Self

## 2017-07-03 ENCOUNTER — Other Ambulatory Visit: Payer: Self-pay | Admitting: Family Medicine

## 2017-07-03 DIAGNOSIS — R55 Syncope and collapse: Secondary | ICD-10-CM

## 2017-07-04 ENCOUNTER — Other Ambulatory Visit: Payer: Self-pay | Admitting: Family Medicine

## 2017-07-04 DIAGNOSIS — R55 Syncope and collapse: Secondary | ICD-10-CM

## 2017-07-05 ENCOUNTER — Ambulatory Visit
Admission: RE | Admit: 2017-07-05 | Discharge: 2017-07-05 | Disposition: A | Discharge status: Home / Self Care | Payer: Medicare Other | Source: Ambulatory Visit | Attending: Family Medicine | Admitting: Family Medicine

## 2017-07-05 DIAGNOSIS — R55 Syncope and collapse: Secondary | ICD-10-CM

## 2017-07-05 DIAGNOSIS — R42 Dizziness and giddiness: Secondary | ICD-10-CM | POA: Diagnosis not present

## 2017-07-07 ENCOUNTER — Ambulatory Visit
Admission: RE | Admit: 2017-07-07 | Discharge: 2017-07-07 | Disposition: A | Discharge status: Home / Self Care | Payer: Medicare Other | Source: Ambulatory Visit | Attending: Family Medicine | Admitting: Family Medicine

## 2017-07-07 DIAGNOSIS — R55 Syncope and collapse: Secondary | ICD-10-CM

## 2017-07-07 DIAGNOSIS — I6523 Occlusion and stenosis of bilateral carotid arteries: Secondary | ICD-10-CM | POA: Diagnosis not present

## 2017-07-10 DIAGNOSIS — F4310 Post-traumatic stress disorder, unspecified: Secondary | ICD-10-CM | POA: Diagnosis not present

## 2017-09-13 DIAGNOSIS — E785 Hyperlipidemia, unspecified: Secondary | ICD-10-CM | POA: Diagnosis not present

## 2017-09-13 DIAGNOSIS — R739 Hyperglycemia, unspecified: Secondary | ICD-10-CM | POA: Diagnosis not present

## 2017-09-13 DIAGNOSIS — I10 Essential (primary) hypertension: Secondary | ICD-10-CM | POA: Diagnosis not present

## 2017-09-13 DIAGNOSIS — Z0000 Encounter for general adult medical examination without abnormal findings: Secondary | ICD-10-CM | POA: Diagnosis not present

## 2017-12-05 ENCOUNTER — Other Ambulatory Visit: Payer: Self-pay | Admitting: Family Medicine

## 2017-12-05 DIAGNOSIS — Z1231 Encounter for screening mammogram for malignant neoplasm of breast: Secondary | ICD-10-CM

## 2017-12-29 ENCOUNTER — Ambulatory Visit
Admission: RE | Admit: 2017-12-29 | Discharge: 2017-12-29 | Disposition: A | Discharge status: Home / Self Care | Payer: Medicare Other | Source: Ambulatory Visit | Attending: Family Medicine | Admitting: Family Medicine

## 2017-12-29 ENCOUNTER — Other Ambulatory Visit: Payer: Self-pay | Admitting: Family Medicine

## 2017-12-29 DIAGNOSIS — R0602 Shortness of breath: Secondary | ICD-10-CM

## 2017-12-29 DIAGNOSIS — E611 Iron deficiency: Secondary | ICD-10-CM | POA: Diagnosis not present

## 2017-12-29 DIAGNOSIS — R5382 Chronic fatigue, unspecified: Secondary | ICD-10-CM | POA: Diagnosis not present

## 2017-12-29 DIAGNOSIS — Z23 Encounter for immunization: Secondary | ICD-10-CM | POA: Diagnosis not present

## 2018-01-02 ENCOUNTER — Ambulatory Visit
Admission: RE | Admit: 2018-01-02 | Discharge: 2018-01-02 | Disposition: A | Discharge status: Home / Self Care | Payer: Medicare Other | Source: Ambulatory Visit | Attending: Family Medicine | Admitting: Family Medicine

## 2018-01-02 DIAGNOSIS — Z1231 Encounter for screening mammogram for malignant neoplasm of breast: Secondary | ICD-10-CM

## 2018-01-11 DIAGNOSIS — H04123 Dry eye syndrome of bilateral lacrimal glands: Secondary | ICD-10-CM | POA: Diagnosis not present

## 2018-01-11 DIAGNOSIS — H2513 Age-related nuclear cataract, bilateral: Secondary | ICD-10-CM | POA: Diagnosis not present

## 2018-01-11 DIAGNOSIS — H524 Presbyopia: Secondary | ICD-10-CM | POA: Diagnosis not present

## 2018-01-11 DIAGNOSIS — E0590 Thyrotoxicosis, unspecified without thyrotoxic crisis or storm: Secondary | ICD-10-CM | POA: Diagnosis not present

## 2018-02-12 DIAGNOSIS — E039 Hypothyroidism, unspecified: Secondary | ICD-10-CM | POA: Diagnosis not present

## 2018-02-20 DIAGNOSIS — J069 Acute upper respiratory infection, unspecified: Secondary | ICD-10-CM | POA: Diagnosis not present

## 2018-02-27 DIAGNOSIS — H16141 Punctate keratitis, right eye: Secondary | ICD-10-CM | POA: Diagnosis not present

## 2018-09-11 DIAGNOSIS — H04123 Dry eye syndrome of bilateral lacrimal glands: Secondary | ICD-10-CM | POA: Diagnosis not present

## 2018-09-11 DIAGNOSIS — H10413 Chronic giant papillary conjunctivitis, bilateral: Secondary | ICD-10-CM | POA: Diagnosis not present

## 2018-10-02 DIAGNOSIS — E039 Hypothyroidism, unspecified: Secondary | ICD-10-CM | POA: Diagnosis not present

## 2018-10-02 DIAGNOSIS — I10 Essential (primary) hypertension: Secondary | ICD-10-CM | POA: Diagnosis not present

## 2018-10-02 DIAGNOSIS — Z0000 Encounter for general adult medical examination without abnormal findings: Secondary | ICD-10-CM | POA: Diagnosis not present

## 2018-10-02 DIAGNOSIS — E785 Hyperlipidemia, unspecified: Secondary | ICD-10-CM | POA: Diagnosis not present

## 2018-11-07 ENCOUNTER — Encounter: Payer: Self-pay | Admitting: Internal Medicine

## 2018-11-07 ENCOUNTER — Ambulatory Visit (INDEPENDENT_AMBULATORY_CARE_PROVIDER_SITE_OTHER): Payer: Medicare Other

## 2018-11-07 ENCOUNTER — Other Ambulatory Visit: Payer: Self-pay

## 2018-11-07 ENCOUNTER — Ambulatory Visit (INDEPENDENT_AMBULATORY_CARE_PROVIDER_SITE_OTHER): Payer: Medicare Other | Admitting: Internal Medicine

## 2018-11-07 DIAGNOSIS — R0600 Dyspnea, unspecified: Secondary | ICD-10-CM

## 2018-11-07 DIAGNOSIS — R0609 Other forms of dyspnea: Secondary | ICD-10-CM

## 2018-11-07 DIAGNOSIS — I10 Essential (primary) hypertension: Secondary | ICD-10-CM | POA: Diagnosis not present

## 2018-11-07 MED ORDER — FAMOTIDINE 20 MG PO TABS: ORAL_TABLET | ORAL | 11 refills | Status: DC

## 2018-11-07 MED ORDER — PANTOPRAZOLE SODIUM 40 MG PO TBEC: 40.0000 mg | DELAYED_RELEASE_TABLET | Freq: Every day | ORAL | 2 refills | Status: DC

## 2018-11-07 NOTE — Patient Instructions (Signed)
To get the most out of exercise, you need to be continuously aware that you are short of breath, but never out of breath, for 30 minutes daily. As you improve, it will actually be easier for you to do the same amount of exercise  in  30 minutes so always push to the level where you are short of breath.    Pantoprazole (protonix) 40 mg   Take  30-60 min before first meal of the day and Pepcid (famotidine)  20 mg one after supper until return to office - this is the best way to tell whether stomach acid is contributing to your problem.    Please remember to go to the lab and x-ray department   for your tests - we will call you with the results when they are available.     Please schedule a follow up office visit in 6 weeks, call sooner if needed if feel you are losing ground
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
## 2018-11-08 ENCOUNTER — Encounter: Payer: Self-pay | Admitting: *Deleted

## 2018-11-08 ENCOUNTER — Encounter: Payer: Self-pay | Admitting: Internal Medicine

## 2018-11-08 LAB — CBC WITH DIFFERENTIAL/PLATELET
Basophils Absolute: 0.1 10*3/uL (ref 0.0–0.1)
Basophils Relative: 0.7 % (ref 0.0–3.0)
Eosinophils Absolute: 0.2 10*3/uL (ref 0.0–0.7)
Eosinophils Relative: 2.7 % (ref 0.0–5.0)
HCT: 41.9 % (ref 36.0–46.0)
Hemoglobin: 14 g/dL (ref 12.0–15.0)
Lymphocytes Relative: 21.8 % (ref 12.0–46.0)
Lymphs Abs: 1.8 10*3/uL (ref 0.7–4.0)
MCHC: 33.4 g/dL (ref 30.0–36.0)
MCV: 93.7 fl (ref 78.0–100.0)
Monocytes Absolute: 0.5 10*3/uL (ref 0.1–1.0)
Monocytes Relative: 6 % (ref 3.0–12.0)
Neutro Abs: 5.6 10*3/uL (ref 1.4–7.7)
Neutrophils Relative %: 68.8 % (ref 43.0–77.0)
Platelets: 239 10*3/uL (ref 150.0–400.0)
RBC: 4.46 Mil/uL (ref 3.87–5.11)
RDW: 13.2 % (ref 11.5–15.5)
WBC: 8.1 10*3/uL (ref 4.0–10.5)

## 2018-11-08 LAB — RESPIRATORY ALLERGY PROFILE REGION II ~~LOC~~
Allergen, A. alternata, m6: <0.1 kU/L
Allergen, Cedar tree, t12: <0.1 kU/L
Allergen, Comm Silver Birch, t9: <0.1 kU/L
Allergen, Cottonwood, t14: <0.1 kU/L
Allergen, D pternoyssinus,d7: 1.42 kU/L — ABNORMAL HIGH
Allergen, Mouse Urine Protein, e78: <0.1 kU/L
Allergen, Mulberry, t76: <0.1 kU/L
Allergen, Oak,t7: <0.1 kU/L
Allergen, P. notatum, m1: <0.1 kU/L
Aspergillus fumigatus, m3: <0.1 kU/L
Bermuda Grass: <0.1 kU/L
Box Elder IgE: <0.1 kU/L
CLADOSPORIUM HERBARUM (M2) IGE: <0.1 kU/L
COMMON RAGWEED (SHORT) (W1) IGE: <0.1 kU/L
Cat Dander: 0.34 kU/L — ABNORMAL HIGH
Class: 0
Class: 0
Class: 0
Class: 0
Class: 0
Class: 0
Class: 0
Class: 0
Class: 0
Class: 0
Class: 0
Class: 0
Class: 0
Class: 0
Class: 0
Class: 0
Class: 0
Class: 0
Class: 0
Class: 0
Class: 0
Class: 0
Class: 2
Class: 2
Cockroach: <0.1 kU/L
D. farinae: 1.55 kU/L — ABNORMAL HIGH
Dog Dander: <0.1 kU/L
Elm IgE: <0.1 kU/L
IgE (Immunoglobulin E), Serum: 19 kU/L (ref <=114)
Johnson Grass: <0.1 kU/L
Pecan/Hickory Tree IgE: <0.1 kU/L
Rough Pigweed  IgE: <0.1 kU/L
Sheep Sorrel IgE: <0.1 kU/L
Timothy Grass: <0.1 kU/L

## 2018-11-08 LAB — BASIC METABOLIC PANEL
BUN: 14 mg/dL (ref 6–23)
CO2: 26 mEq/L (ref 19–32)
Calcium: 9.7 mg/dL (ref 8.4–10.5)
Chloride: 104 mEq/L (ref 96–112)
Creatinine, Ser: 1 mg/dL (ref 0.40–1.20)
GFR: 55.03 mL/min — ABNORMAL LOW (ref >60.00)
Glucose, Bld: 90 mg/dL (ref 70–99)
Potassium: 4.1 mEq/L (ref 3.5–5.1)
Sodium: 140 mEq/L (ref 135–145)

## 2018-11-08 LAB — BRAIN NATRIURETIC PEPTIDE: Pro B Natriuretic peptide (BNP): 46 pg/mL (ref 0.0–100.0)

## 2018-11-08 LAB — D-DIMER, QUANTITATIVE: D-Dimer, Quant: 0.29 mcg/mL FEU (ref <0.50)

## 2018-11-08 LAB — TSH: TSH: 1.82 u[IU]/mL (ref 0.35–4.50)

## 2018-11-08 LAB — INTERPRETATION:

## 2018-11-08 NOTE — Assessment & Plan Note (Addendum)
Onset summer 2019 - Cards w/u 06/24/13  Burt Knack dx non cardiac cp 1. Patent coronary arteries without evidence of coronary obstruction  2. Normal LV systolic function  Recommendations: Reassurance. Suspect noncardiac chest pain.  - 11/07/2018   Walked RA  2 laps @  approx 259ft each @ fast  pace  stopped due to  End of study   - rec gerd rx, submax exercise.  Symptoms are markedly disproportionate to objective findings and not clear to what extent this is actually a pulmonary  problem but pt does appear to have difficult to sort out respiratory symptoms of unknown origin for which  DDX  = almost all start with A and  include Adherence, Ace Inhibitors, Acid Reflux, Active Sinus Disease, Alpha 1 Antitripsin deficiency, Anxiety masquerading as Airways dz,  ABPA,  Allergy(esp in young), Aspiration (esp in elderly), Adverse effects of meds,  Active smoking or Vaping, A bunch of PE's/clot burden (a few small clots can't cause this syndrome unless there is already severe underlying pulm or vascular dz with poor reserve),  Anemia or thyroid disorder, plus two Bs  = Bronchiectasis and Beta blocker use..and one C= CHF     Adherence is always the initial "prime suspect" and is a multilayered concern that requires a "trust but verify" approach in every patient - starting with knowing how to use medications, especially inhalers, correctly, keeping up with refills and understanding the fundamental difference between maintenance and prns vs those medications only taken for a very short course and then stopped and not refilled.   ? Acid (or non-acid) GERD > always difficult to exclude as up to 75% of pts in some series report no assoc GI/ Heartburn symptoms (and her non cardiac cp that keeps recurring since neg cath is c/w es spasm)  rec max (24h)  acid suppression and diet restrictions/ reviewed and instructions given in writing.    ? Adverse drug effects > none listed but on very high doses of lipitor which could  cause mild muscle aches/ fatigue, not usually sob   ? Allergy/asthma >  "tickle" in throat is more c/w reflux in absence of noct awakening > send profile to be complete   ? Anxiety/depression  > usually at the bottom of this list of usual suspects but should be included  and may interfere with adherence and also interpretation of response or lack thereof to symptom management which can be quite subjective.   ? A bunch of PE's  >  D dimer nl - while a normal  or high normal value (seen commonly in the elderly or chronically ill)  may miss small peripheral pe, the clot burden with sob is moderately high and the d dimer  has a very high neg pred value if used in this setting.   Anemia, Thryroid dz >> ruled out today     ? CHF >  Neg cath in 2015 for same cp reassuring, BNP nl also today    >>> re-conditioning discussed, f/u in 6 weeks to consider cpst     Total time devoted to counseling  > 50 % of initial 60 min office visit:  reviewed case with pt/  directly observed portions of ambulatory 02 saturation study/ discussion of options/alternatives/ personally creating written customized instructions  in presence of pt  then going over those specific  Instructions directly with the pt including how to use all of the meds but in particular covering each new medication in detail and the difference between  the maintenance= "automatic" meds and the prns using an action plan format for the latter (If this problem/symptom => do that organization reading Left to right).  Please see AVS from this visit for a full list of these instructions which I personally wrote for this pt and  are unique to this visit.

## 2018-11-08 NOTE — Assessment & Plan Note (Signed)
Not ideally controlled at present and could contribute to diastolic dysfunction as cause of doe > needs f/u with PCP and perhaps higher dose of BB with the caveat that in  the setting of respiratory symptoms of unknown etiology,  It would be preferable to use bystolic, the most beta -1  selective Beta blocker available in sample form, with bisoprolol the most selective generic choice  on the market, at least on a trial basis, to make sure the spillover Beta 2 effects of the less specific Beta blockers are not contributing to this patient's symptoms.

## 2018-11-19 ENCOUNTER — Other Ambulatory Visit: Payer: Self-pay | Admitting: Family Medicine

## 2018-11-19 DIAGNOSIS — Z1231 Encounter for screening mammogram for malignant neoplasm of breast: Secondary | ICD-10-CM

## 2018-11-20 ENCOUNTER — Telehealth: Payer: Self-pay | Admitting: Internal Medicine

## 2018-11-20 NOTE — Telephone Encounter (Signed)
Called and spoke with patient.  Patient requested her annual flu vaccine.  Patient scheduled 11/21/18, 2:30pm.  Covid question negative.  Nothing further at this time.

## 2018-11-21 ENCOUNTER — Ambulatory Visit (INDEPENDENT_AMBULATORY_CARE_PROVIDER_SITE_OTHER): Payer: Medicare Other

## 2018-11-21 DIAGNOSIS — Z23 Encounter for immunization: Secondary | ICD-10-CM

## 2018-11-21 NOTE — Progress Notes (Signed)
Patient came to office today requesting flu vaccine.  Vaccine info given.  High dose flu vaccine given.  Nothing further at this time.

## 2018-12-19 ENCOUNTER — Other Ambulatory Visit: Payer: Self-pay

## 2018-12-19 ENCOUNTER — Ambulatory Visit (INDEPENDENT_AMBULATORY_CARE_PROVIDER_SITE_OTHER): Payer: Medicare Other | Admitting: Internal Medicine

## 2018-12-19 ENCOUNTER — Encounter: Payer: Self-pay | Admitting: Internal Medicine

## 2018-12-19 DIAGNOSIS — R0789 Other chest pain: Secondary | ICD-10-CM

## 2018-12-19 DIAGNOSIS — R0600 Dyspnea, unspecified: Secondary | ICD-10-CM

## 2018-12-19 DIAGNOSIS — R0609 Other forms of dyspnea: Secondary | ICD-10-CM | POA: Diagnosis not present

## 2018-12-19 NOTE — Patient Instructions (Addendum)
GERD (REFLUX)  is an extremely common cause of respiratory symptoms just like yours , many times with no obvious heartburn at all.    It can be treated with medication, but also with lifestyle changes including elevation of the head of your bed (ideally with 6 -8inch blocks under the headboard of your bed),  Smoking cessation, avoidance of late meals, excessive alcohol, and avoid fatty foods, chocolate, peppermint, colas, red wine, and acidic juices such as orange juice.  NO MINT OR MENTHOL PRODUCTS SO NO COUGH DROPS  USE SUGARLESS CANDY INSTEAD (Jolley ranchers or Stover's or Life Savers) or even ice chips will also do - the key is to swallow to prevent all throat clearing. NO OIL BASED VITAMINS - use powdered substitutes.  Avoid fish oil when coughing.    Classic subdiaphragmatic pain pattern suggests ibs:    Treatment consists of avoiding foods that cause gas (especially boiled eggs, mexcican food but especially  beans and undercooked vegetables like  spinach and some salads)  And if not improving add:  citrucel 1 heaping tsp twice daily with a large glass of water.  Pain should improve w/in 2 weeks and if not then consider further GI work up.       If you are satisfied with your treatment plan,  let your doctor know and he/she can either refill your medications or you can return here when your prescription runs out.     If in any way you are not 100% satisfied,  please tell us.  If 100% better, tell your friends!  Pulmonary follow up is as needed
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
## 2018-12-20 ENCOUNTER — Encounter: Payer: Self-pay | Admitting: Internal Medicine

## 2018-12-20 DIAGNOSIS — R0789 Other chest pain: Secondary | ICD-10-CM | POA: Insufficient documentation

## 2018-12-20 NOTE — Assessment & Plan Note (Signed)
Onset fall 2019  -  12/19/2018 rec try ibs diet/ citrucel and f/u GI prn   Previous w/u for atypical cp neg in 2015 including LHC so no need to pursue cardiac or pulmonary source at this time and f/u with cpst is prn   >>> rec as above/ reviewed pathyophysiology and difference between cardiac and GI/GU sources of pain and what she's been having for > 1 year typical of IBS  "gas pain"    I had an extended discussion with the patient reviewing all relevant studies completed to date and  lasting 15 to 20 minutes of a 25 minute final summary office  visit    Each maintenance medication was reviewed in detail including most importantly the difference between maintenance and prns and under what circumstances the prns are to be triggered using an action plan format that is not reflected in the computer generated alphabetically organized AVS.     Please see AVS for specific instructions unique to this visit that I personally wrote and verbalized to the the pt in detail and then reviewed with pt  by my nurse highlighting any  changes in therapy recommended at today's visit to their plan of care.

## 2018-12-20 NOTE — Assessment & Plan Note (Signed)
Onset summer 2019 - Cards w/u 06/24/13  Burt Knack dx non cardiac cp 1. Patent coronary arteries without evidence of coronary obstruction  2. Normal LV systolic function  Recommendations: Reassurance. Suspect noncardiac chest pain.  - 11/07/2018   Walked RA  2 laps @  approx 281ft each @ fast  pace  stopped due to  End of study   - rec gerd rx, submax exercise. - Allergy profile 11/07/2018 >  Eos 0.2/  IgE  13  Pos cats/ dust   Improving to her satisfaction s ongoing gerd rx but now c/w atypical cp/abd pain c/w ibs   (see separate a/p)   rec regular submax exercise and call if trending down for cpst next step.,

## 2019-01-07 ENCOUNTER — Other Ambulatory Visit: Payer: Self-pay

## 2019-01-07 ENCOUNTER — Ambulatory Visit
Admission: RE | Admit: 2019-01-07 | Discharge: 2019-01-07 | Disposition: A | Discharge status: Home / Self Care | Payer: Medicare Other | Source: Ambulatory Visit | Attending: Family Medicine | Admitting: Family Medicine

## 2019-01-07 DIAGNOSIS — Z1231 Encounter for screening mammogram for malignant neoplasm of breast: Secondary | ICD-10-CM | POA: Diagnosis not present

## 2019-01-17 DIAGNOSIS — H04123 Dry eye syndrome of bilateral lacrimal glands: Secondary | ICD-10-CM | POA: Diagnosis not present

## 2019-01-17 DIAGNOSIS — H10413 Chronic giant papillary conjunctivitis, bilateral: Secondary | ICD-10-CM | POA: Diagnosis not present

## 2019-01-17 DIAGNOSIS — E0590 Thyrotoxicosis, unspecified without thyrotoxic crisis or storm: Secondary | ICD-10-CM | POA: Diagnosis not present

## 2019-01-17 DIAGNOSIS — H2513 Age-related nuclear cataract, bilateral: Secondary | ICD-10-CM | POA: Diagnosis not present

## 2019-04-12 DIAGNOSIS — H10413 Chronic giant papillary conjunctivitis, bilateral: Secondary | ICD-10-CM | POA: Diagnosis not present

## 2019-04-12 DIAGNOSIS — H04123 Dry eye syndrome of bilateral lacrimal glands: Secondary | ICD-10-CM | POA: Diagnosis not present

## 2019-05-05 ENCOUNTER — Ambulatory Visit: Payer: Medicare Other | Attending: Internal Medicine

## 2019-05-05 DIAGNOSIS — Z23 Encounter for immunization: Secondary | ICD-10-CM | POA: Insufficient documentation

## 2019-05-05 NOTE — Progress Notes (Signed)
   Covid-19 Vaccination Clinic  Name:  Patricia Chambers    MRN: [id number] DOB: 11-11-1950  05/05/2019  [name] was observed post Covid-19 immunization for 15 minutes without incidence. She was provided with Vaccine Information Sheet and instruction to access the V-Safe system.   [name] was instructed to call 911 with any severe reactions post vaccine: Marland Kitchen Difficulty breathing  . Swelling of your face and throat  . A fast heartbeat  . A bad rash all over your body  . Dizziness and weakness    Immunizations Administered    Name Date Dose VIS Date Route   Pfizer COVID-19 Vaccine 05/05/2019  3:54 PM 0.3 mL 03/08/2019 Intramuscular   Manufacturer: [place]   Lot: [id number]   [place]: [id number]

## 2019-05-21 ENCOUNTER — Ambulatory Visit: Payer: Medicare Other

## 2019-05-30 ENCOUNTER — Ambulatory Visit: Payer: Medicare Other | Attending: Internal Medicine

## 2019-05-30 DIAGNOSIS — Z23 Encounter for immunization: Secondary | ICD-10-CM | POA: Insufficient documentation

## 2019-05-30 NOTE — Progress Notes (Signed)
   Covid-19 Vaccination Clinic  Name:  Patricia Chambers    MRN: [id number] DOB: March 16, 1951  05/30/2019  [name] was observed post Covid-19 immunization for 15 minutes without incident. She was provided with Vaccine Information Sheet and instruction to access the V-Safe system.   [name] was instructed to call 911 with any severe reactions post vaccine: Marland Kitchen Difficulty breathing  . Swelling of face and throat  . A fast heartbeat  . A bad rash all over body  . Dizziness and weakness   Immunizations Administered    Name Date Dose VIS Date Route   Pfizer COVID-19 Vaccine 05/30/2019 10:52 AM 0.3 mL 03/08/2019 Intramuscular   Manufacturer: [place]   Lot: [id number]   [place]: [id number]

## 2019-07-04 ENCOUNTER — Encounter (HOSPITAL_COMMUNITY): Payer: Self-pay | Admitting: Emergency Medicine

## 2019-07-04 ENCOUNTER — Other Ambulatory Visit: Payer: Self-pay

## 2019-07-04 ENCOUNTER — Emergency Department (HOSPITAL_COMMUNITY)
Admission: EM | Admit: 2019-07-04 | Discharge: 2019-07-04 | Disposition: A | Discharge status: Home / Self Care | Payer: Medicare Other | Attending: Emergency Medicine | Admitting: Emergency Medicine

## 2019-07-04 ENCOUNTER — Emergency Department (HOSPITAL_COMMUNITY): Payer: Medicare Other

## 2019-07-04 DIAGNOSIS — Z87891 Personal history of nicotine dependence: Secondary | ICD-10-CM | POA: Diagnosis not present

## 2019-07-04 DIAGNOSIS — Z79899 Other long term (current) drug therapy: Secondary | ICD-10-CM | POA: Diagnosis not present

## 2019-07-04 DIAGNOSIS — S82831A Other fracture of upper and lower end of right fibula, initial encounter for closed fracture: Secondary | ICD-10-CM | POA: Diagnosis not present

## 2019-07-04 DIAGNOSIS — X501XXA Overexertion from prolonged static or awkward postures, initial encounter: Secondary | ICD-10-CM | POA: Diagnosis not present

## 2019-07-04 DIAGNOSIS — S8261XA Displaced fracture of lateral malleolus of right fibula, initial encounter for closed fracture: Secondary | ICD-10-CM | POA: Insufficient documentation

## 2019-07-04 DIAGNOSIS — S8002XA Contusion of left knee, initial encounter: Secondary | ICD-10-CM | POA: Diagnosis not present

## 2019-07-04 DIAGNOSIS — R609 Edema, unspecified: Secondary | ICD-10-CM | POA: Diagnosis not present

## 2019-07-04 DIAGNOSIS — Y999 Unspecified external cause status: Secondary | ICD-10-CM | POA: Insufficient documentation

## 2019-07-04 DIAGNOSIS — Z743 Need for continuous supervision: Secondary | ICD-10-CM | POA: Diagnosis not present

## 2019-07-04 DIAGNOSIS — W19XXXA Unspecified fall, initial encounter: Secondary | ICD-10-CM | POA: Diagnosis not present

## 2019-07-04 DIAGNOSIS — I10 Essential (primary) hypertension: Secondary | ICD-10-CM | POA: Diagnosis not present

## 2019-07-04 DIAGNOSIS — Z7982 Long term (current) use of aspirin: Secondary | ICD-10-CM | POA: Diagnosis not present

## 2019-07-04 DIAGNOSIS — R52 Pain, unspecified: Secondary | ICD-10-CM | POA: Diagnosis not present

## 2019-07-04 DIAGNOSIS — J45909 Unspecified asthma, uncomplicated: Secondary | ICD-10-CM | POA: Insufficient documentation

## 2019-07-04 DIAGNOSIS — S8992XA Unspecified injury of left lower leg, initial encounter: Secondary | ICD-10-CM | POA: Diagnosis not present

## 2019-07-04 DIAGNOSIS — Y929 Unspecified place or not applicable: Secondary | ICD-10-CM | POA: Diagnosis not present

## 2019-07-04 DIAGNOSIS — S82461A Displaced segmental fracture of shaft of right fibula, initial encounter for closed fracture: Secondary | ICD-10-CM | POA: Diagnosis not present

## 2019-07-04 DIAGNOSIS — Y939 Activity, unspecified: Secondary | ICD-10-CM | POA: Insufficient documentation

## 2019-07-04 DIAGNOSIS — M25562 Pain in left knee: Secondary | ICD-10-CM | POA: Diagnosis not present

## 2019-07-04 DIAGNOSIS — S82464A Nondisplaced segmental fracture of shaft of right fibula, initial encounter for closed fracture: Secondary | ICD-10-CM | POA: Diagnosis not present

## 2019-07-04 DIAGNOSIS — S8991XA Unspecified injury of right lower leg, initial encounter: Secondary | ICD-10-CM | POA: Diagnosis present

## 2019-07-04 NOTE — ED Triage Notes (Signed)
Per [facility] pt from home. Pt fell in her backyard going down last step. Right ankle and left knee injuries. Has swelling to ankle. Knee is tender to touch. Takes a baby ASA.  Vitals: 180/100, 112HR, 97% on RA, 20R.

## 2019-07-04 NOTE — ED Provider Notes (Signed)
[facility] DEPT Provider Note   CSN: [id number] Arrival date & time: 07/04/19  1640     History Chief Complaint  Patient presents with  . Fall  . Ankle Pain  . Knee Pain    Patricia Chambers is a 69 y.o. female.  The history is provided by the patient. No language interpreter was used.  Fall This is a new problem. The current episode started 1 to 2 hours ago. The problem occurs constantly. The problem has not changed since onset.Pertinent negatives include no chest pain, no abdominal pain and no headaches. Nothing aggravates the symptoms. Nothing relieves the symptoms. She has tried nothing for the symptoms.  Ankle Pain Knee Pain  Pt reports she twisted her right ankle and fell.  Pt reports she hit her left knee.  Pt reports she has had some knee pain prior due to arthritis. Pt has arthritis in hands and hips    Past Medical History:  Diagnosis Date  . Asthma   . Complication of anesthesia    hard to wake up  . FHx: migraine headaches   . Hx of colonic polyps   . Hypertension   . PONV (postoperative nausea and vomiting)   . Sarcoidosis     Patient Active Problem List   Diagnosis Date Noted  . Atypical chest pain 12/20/2018  . DOE (dyspnea on exertion) 11/07/2018  . Essential hypertension 06/21/2013  . Dyslipidemia 06/21/2013  . Family history of coronary artery disease 06/21/2013  . Chest pain with moderate risk of acute coronary syndrome 06/20/2013    Past Surgical History:  Procedure Laterality Date  . LEFT HEART CATHETERIZATION WITH CORONARY ANGIOGRAM N/A 06/24/2013   Procedure: LEFT HEART CATHETERIZATION WITH CORONARY ANGIOGRAM;  Surgeon: Blane Ohara, MD;  Location: [facility] CATH LAB;  Service: Cardiovascular;  Laterality: N/A;  . TEMPOROMANDIBULAR JOINT SURGERY       OB History   No obstetric history on file.     No family history on file.  Social History   Tobacco Use  . Smoking status: Former Smoker    Packs/day: 1.00    Years: 5.00    Pack years: 5.00    Types: Cigarettes    Quit date: 03/28/1974    Years since quitting: 45.2  . Smokeless tobacco: Never Used  Substance Use Topics  . Alcohol use: Yes    Alcohol/week: 5.0 standard drinks    Types: 5 Glasses of wine per week    Comment: Or more  . Drug use: No    Home Medications Prior to Admission medications   Medication Sig Start Date End Date Taking? Authorizing Provider  Ascorbic Acid (VITAMIN C) 500 MG tablet Take 500 mg by mouth daily.      [provider]  aspirin EC 81 MG tablet Take 81 mg by mouth daily.    [provider]  fluticasone (FLONASE) 50 MCG/ACT nasal spray Place 2 sprays into both nostrils daily as needed for allergies or rhinitis.    [provider]  levothyroxine (SYNTHROID) 25 MCG tablet Take 25 mcg by mouth daily before breakfast.    [provider]  Multiple Vitamin (MULTIVITAMIN) capsule Take 1 capsule by mouth daily.    [provider]    Allergies    Naproxen and Penicillins  Review of Systems   Review of Systems  Cardiovascular: Negative for chest pain.  Gastrointestinal: Negative for abdominal pain.  Neurological: Negative for headaches.  All other systems reviewed and are negative.  Physical Exam Updated Vital Signs There were no vitals taken for this visit.  Physical Exam Vitals and nursing note reviewed.  Pulmonary:     Effort: Pulmonary effort is normal.  Musculoskeletal:        General: Swelling, tenderness and deformity present.     Comments: Swollen tender right ankle,  Good pulse.  Left knee no deformity no bruising   Skin:    General: Skin is warm.  Neurological:     General: No focal deficit present.     Mental Status: She is alert.  Psychiatric:        Mood and Affect: Mood normal.     ED Results / Procedures / Treatments   Labs (all labs ordered are listed, but only abnormal results are displayed) Labs Reviewed - No data to  display  EKG None  Radiology DG Ankle Complete Right  Result Date: 07/04/2019 CLINICAL DATA:  [place] with lateral pain and swelling. EXAM: RIGHT ANKLE - COMPLETE 3+ VIEW COMPARISON:  None. FINDINGS: Pronounced lateral soft tissue swelling. Avulsion fractures at the tip of the fibula. Ankle mortise intact. No other ankle region injury. IMPRESSION: Avulsion fractures of the tip of the fibula. Lateral soft tissue swelling. Electronically Signed   By: Nelson Chimes M.D.   On: 07/04/2019 17:47   DG Knee Complete 4 Views Left  Result Date: 07/04/2019 CLINICAL DATA:  69 year old female with fall and left knee pain. EXAM: LEFT KNEE - COMPLETE 4+ VIEW COMPARISON:  None. FINDINGS: No acute fracture or dislocation. Mild osteopenia and mild arthritic changes with mild narrowing of the medial compartment. No joint effusion. The soft tissues are unremarkable. IMPRESSION: No acute fracture or dislocation. Electronically Signed   By: Anner Crete M.D.   On: 07/04/2019 17:47    Procedures Procedures (including critical care time)  Medications Ordered in ED Medications - No data to display  ED Course  I have reviewed the triage vital signs and the nursing notes.  Pertinent labs & imaging results that were available during my care of the patient were reviewed by me and considered in my medical decision making (see chart for details).    MDM Rules/Calculators/A&P                      MDM   Pt counseled on distal fibula fracture and need for follow up.  Pt placed in a splint and give crutches  Final Clinical Impression(s) / ED Diagnoses Final diagnoses:  Closed nondisplaced segmental fracture of shaft of right fibula, initial encounter  Contusion of left knee, initial encounter    Rx / DC Orders ED Discharge Orders    None    An After Visit Summary was printed and given to the patient.    Fransico Meadow, [place] 07/04/19 Cato Mulligan    Charlesetta Shanks, MD 07/15/19 [phone]

## 2019-07-09 DIAGNOSIS — S8261XA Displaced fracture of lateral malleolus of right fibula, initial encounter for closed fracture: Secondary | ICD-10-CM | POA: Diagnosis not present

## 2019-07-09 DIAGNOSIS — M25571 Pain in right ankle and joints of right foot: Secondary | ICD-10-CM | POA: Diagnosis not present

## 2019-07-12 DIAGNOSIS — R5382 Chronic fatigue, unspecified: Secondary | ICD-10-CM | POA: Diagnosis not present

## 2019-07-12 DIAGNOSIS — M1990 Unspecified osteoarthritis, unspecified site: Secondary | ICD-10-CM | POA: Diagnosis not present

## 2019-07-12 DIAGNOSIS — I499 Cardiac arrhythmia, unspecified: Secondary | ICD-10-CM | POA: Diagnosis not present

## 2019-07-26 DIAGNOSIS — S93401A Sprain of unspecified ligament of right ankle, initial encounter: Secondary | ICD-10-CM | POA: Diagnosis not present

## 2019-08-23 DIAGNOSIS — S93401D Sprain of unspecified ligament of right ankle, subsequent encounter: Secondary | ICD-10-CM | POA: Diagnosis not present

## 2019-09-02 DIAGNOSIS — S93491D Sprain of other ligament of right ankle, subsequent encounter: Secondary | ICD-10-CM | POA: Diagnosis not present

## 2019-09-02 DIAGNOSIS — R262 Difficulty in walking, not elsewhere classified: Secondary | ICD-10-CM | POA: Diagnosis not present

## 2019-09-06 DIAGNOSIS — R262 Difficulty in walking, not elsewhere classified: Secondary | ICD-10-CM | POA: Diagnosis not present

## 2019-09-06 DIAGNOSIS — S93491D Sprain of other ligament of right ankle, subsequent encounter: Secondary | ICD-10-CM | POA: Diagnosis not present

## 2019-09-10 DIAGNOSIS — R262 Difficulty in walking, not elsewhere classified: Secondary | ICD-10-CM | POA: Diagnosis not present

## 2019-09-10 DIAGNOSIS — S93491D Sprain of other ligament of right ankle, subsequent encounter: Secondary | ICD-10-CM | POA: Diagnosis not present

## 2019-09-13 DIAGNOSIS — S93491D Sprain of other ligament of right ankle, subsequent encounter: Secondary | ICD-10-CM | POA: Diagnosis not present

## 2019-09-13 DIAGNOSIS — R262 Difficulty in walking, not elsewhere classified: Secondary | ICD-10-CM | POA: Diagnosis not present

## 2019-10-15 DIAGNOSIS — M7062 Trochanteric bursitis, left hip: Secondary | ICD-10-CM | POA: Diagnosis not present

## 2019-10-15 DIAGNOSIS — I10 Essential (primary) hypertension: Secondary | ICD-10-CM | POA: Diagnosis not present

## 2019-10-15 DIAGNOSIS — E785 Hyperlipidemia, unspecified: Secondary | ICD-10-CM | POA: Diagnosis not present

## 2019-10-15 DIAGNOSIS — E039 Hypothyroidism, unspecified: Secondary | ICD-10-CM | POA: Diagnosis not present

## 2019-10-17 DIAGNOSIS — Z23 Encounter for immunization: Secondary | ICD-10-CM | POA: Diagnosis not present

## 2019-10-17 DIAGNOSIS — R030 Elevated blood-pressure reading, without diagnosis of hypertension: Secondary | ICD-10-CM | POA: Diagnosis not present

## 2019-10-17 DIAGNOSIS — Z0000 Encounter for general adult medical examination without abnormal findings: Secondary | ICD-10-CM | POA: Diagnosis not present

## 2019-10-22 DIAGNOSIS — M7062 Trochanteric bursitis, left hip: Secondary | ICD-10-CM | POA: Diagnosis not present

## 2019-10-23 DIAGNOSIS — M7062 Trochanteric bursitis, left hip: Secondary | ICD-10-CM | POA: Diagnosis not present

## 2019-10-29 DIAGNOSIS — M7062 Trochanteric bursitis, left hip: Secondary | ICD-10-CM | POA: Diagnosis not present

## 2019-10-31 DIAGNOSIS — M7062 Trochanteric bursitis, left hip: Secondary | ICD-10-CM | POA: Diagnosis not present

## 2019-11-05 DIAGNOSIS — M7062 Trochanteric bursitis, left hip: Secondary | ICD-10-CM | POA: Diagnosis not present

## 2019-11-07 DIAGNOSIS — M7062 Trochanteric bursitis, left hip: Secondary | ICD-10-CM | POA: Diagnosis not present

## 2019-11-12 DIAGNOSIS — M7062 Trochanteric bursitis, left hip: Secondary | ICD-10-CM | POA: Diagnosis not present

## 2019-11-14 DIAGNOSIS — M7062 Trochanteric bursitis, left hip: Secondary | ICD-10-CM | POA: Diagnosis not present

## 2019-11-18 DIAGNOSIS — M7062 Trochanteric bursitis, left hip: Secondary | ICD-10-CM | POA: Diagnosis not present

## 2019-11-20 DIAGNOSIS — M7062 Trochanteric bursitis, left hip: Secondary | ICD-10-CM | POA: Diagnosis not present

## 2019-12-01 DIAGNOSIS — Z20822 Contact with and (suspected) exposure to covid-19: Secondary | ICD-10-CM | POA: Diagnosis not present

## 2019-12-03 DIAGNOSIS — H903 Sensorineural hearing loss, bilateral: Secondary | ICD-10-CM | POA: Diagnosis not present

## 2019-12-03 DIAGNOSIS — E039 Hypothyroidism, unspecified: Secondary | ICD-10-CM | POA: Diagnosis not present

## 2019-12-16 ENCOUNTER — Other Ambulatory Visit: Payer: Self-pay | Admitting: Family Medicine

## 2019-12-16 DIAGNOSIS — Z1231 Encounter for screening mammogram for malignant neoplasm of breast: Secondary | ICD-10-CM

## 2019-12-26 DIAGNOSIS — H524 Presbyopia: Secondary | ICD-10-CM | POA: Diagnosis not present

## 2019-12-26 DIAGNOSIS — H04123 Dry eye syndrome of bilateral lacrimal glands: Secondary | ICD-10-CM | POA: Diagnosis not present

## 2020-01-03 DIAGNOSIS — Z23 Encounter for immunization: Secondary | ICD-10-CM | POA: Diagnosis not present

## 2020-01-08 ENCOUNTER — Ambulatory Visit: Payer: Medicare Other

## 2020-01-30 DIAGNOSIS — R109 Unspecified abdominal pain: Secondary | ICD-10-CM | POA: Diagnosis not present

## 2020-01-30 DIAGNOSIS — K5792 Diverticulitis of intestine, part unspecified, without perforation or abscess without bleeding: Secondary | ICD-10-CM | POA: Diagnosis not present

## 2020-02-01 DIAGNOSIS — K5792 Diverticulitis of intestine, part unspecified, without perforation or abscess without bleeding: Secondary | ICD-10-CM | POA: Diagnosis not present

## 2020-02-07 DIAGNOSIS — R5382 Chronic fatigue, unspecified: Secondary | ICD-10-CM | POA: Diagnosis not present

## 2020-02-07 DIAGNOSIS — E611 Iron deficiency: Secondary | ICD-10-CM | POA: Diagnosis not present

## 2020-02-07 DIAGNOSIS — E538 Deficiency of other specified B group vitamins: Secondary | ICD-10-CM | POA: Diagnosis not present

## 2020-02-07 DIAGNOSIS — R1011 Right upper quadrant pain: Secondary | ICD-10-CM | POA: Diagnosis not present

## 2020-02-07 DIAGNOSIS — K5792 Diverticulitis of intestine, part unspecified, without perforation or abscess without bleeding: Secondary | ICD-10-CM | POA: Diagnosis not present

## 2020-02-07 DIAGNOSIS — R399 Unspecified symptoms and signs involving the genitourinary system: Secondary | ICD-10-CM | POA: Diagnosis not present

## 2020-02-14 ENCOUNTER — Ambulatory Visit
Admission: RE | Admit: 2020-02-14 | Discharge: 2020-02-14 | Disposition: A | Discharge status: Home / Self Care | Payer: Medicare Other | Source: Ambulatory Visit | Attending: Family Medicine | Admitting: Family Medicine

## 2020-02-14 ENCOUNTER — Other Ambulatory Visit: Payer: Self-pay

## 2020-02-14 DIAGNOSIS — Z1231 Encounter for screening mammogram for malignant neoplasm of breast: Secondary | ICD-10-CM

## 2020-06-16 ENCOUNTER — Encounter (HOSPITAL_COMMUNITY): Payer: Self-pay

## 2020-06-16 ENCOUNTER — Emergency Department (HOSPITAL_COMMUNITY)
Admission: EM | Admit: 2020-06-16 | Discharge: 2020-06-17 | Disposition: A | Discharge status: Home / Self Care | Payer: Medicare Other | Attending: Emergency Medicine | Admitting: Emergency Medicine

## 2020-06-16 ENCOUNTER — Emergency Department (HOSPITAL_COMMUNITY): Payer: Medicare Other

## 2020-06-16 ENCOUNTER — Other Ambulatory Visit: Payer: Self-pay

## 2020-06-16 DIAGNOSIS — W16312A Fall into other water striking water surface causing other injury, initial encounter: Secondary | ICD-10-CM | POA: Diagnosis not present

## 2020-06-16 DIAGNOSIS — Z79899 Other long term (current) drug therapy: Secondary | ICD-10-CM | POA: Insufficient documentation

## 2020-06-16 DIAGNOSIS — I10 Essential (primary) hypertension: Secondary | ICD-10-CM | POA: Insufficient documentation

## 2020-06-16 DIAGNOSIS — S76302A Unspecified injury of muscle, fascia and tendon of the posterior muscle group at thigh level, left thigh, initial encounter: Secondary | ICD-10-CM | POA: Insufficient documentation

## 2020-06-16 DIAGNOSIS — R000 Tachycardia, unspecified: Secondary | ICD-10-CM | POA: Diagnosis not present

## 2020-06-16 DIAGNOSIS — M25552 Pain in left hip: Secondary | ICD-10-CM | POA: Diagnosis not present

## 2020-06-16 DIAGNOSIS — S8992XA Unspecified injury of left lower leg, initial encounter: Secondary | ICD-10-CM | POA: Diagnosis present

## 2020-06-16 DIAGNOSIS — J45909 Unspecified asthma, uncomplicated: Secondary | ICD-10-CM | POA: Insufficient documentation

## 2020-06-16 DIAGNOSIS — Y92000 Kitchen of unspecified non-institutional (private) residence as  the place of occurrence of the external cause: Secondary | ICD-10-CM | POA: Insufficient documentation

## 2020-06-16 DIAGNOSIS — Z7952 Long term (current) use of systemic steroids: Secondary | ICD-10-CM | POA: Insufficient documentation

## 2020-06-16 DIAGNOSIS — Z87891 Personal history of nicotine dependence: Secondary | ICD-10-CM | POA: Insufficient documentation

## 2020-06-16 DIAGNOSIS — W19XXXA Unspecified fall, initial encounter: Secondary | ICD-10-CM

## 2020-06-16 DIAGNOSIS — Z20822 Contact with and (suspected) exposure to covid-19: Secondary | ICD-10-CM | POA: Diagnosis not present

## 2020-06-16 DIAGNOSIS — S76319A Strain of muscle, fascia and tendon of the posterior muscle group at thigh level, unspecified thigh, initial encounter: Secondary | ICD-10-CM

## 2020-06-16 LAB — BASIC METABOLIC PANEL
Anion gap: 8 (ref 5–15)
BUN: 11 mg/dL (ref 8–23)
CO2: 23 mmol/L (ref 22–32)
Calcium: 8.9 mg/dL (ref 8.9–10.3)
Chloride: 105 mmol/L (ref 98–111)
Creatinine, Ser: 0.92 mg/dL (ref 0.44–1.00)
GFR, Estimated: >60 mL/min (ref >60)
Glucose, Bld: 115 mg/dL — ABNORMAL HIGH (ref 70–99)
Potassium: 3.4 mmol/L — ABNORMAL LOW (ref 3.5–5.1)
Sodium: 136 mmol/L (ref 135–145)

## 2020-06-16 LAB — CBC
HCT: 40.9 % (ref 36.0–46.0)
Hemoglobin: 13 g/dL (ref 12.0–15.0)
MCH: 28.8 pg (ref 26.0–34.0)
MCHC: 31.8 g/dL (ref 30.0–36.0)
MCV: 90.5 fL (ref 80.0–100.0)
Platelets: 226 10*3/uL (ref 150–400)
RBC: 4.52 MIL/uL (ref 3.87–5.11)
RDW: 13.9 % (ref 11.5–15.5)
WBC: 7.7 10*3/uL (ref 4.0–10.5)
nRBC: 0 % (ref 0.0–0.2)

## 2020-06-16 MED ORDER — LORAZEPAM 2 MG/ML IJ SOLN
1.0000 mg | Freq: Once | INTRAMUSCULAR | Status: AC
  Administered 2020-06-17: 1 mg via INTRAVENOUS
  Filled 2020-06-16: qty 1

## 2020-06-16 MED ORDER — ONDANSETRON HCL 4 MG/2ML IJ SOLN
4.0000 mg | Freq: Once | INTRAMUSCULAR | Status: AC
  Administered 2020-06-16: 4 mg via INTRAVENOUS
  Filled 2020-06-16: qty 2

## 2020-06-16 MED ORDER — ASPIRIN 81 MG PO CHEW
81.0000 mg | CHEWABLE_TABLET | Freq: Once | ORAL | Status: AC
  Administered 2020-06-17: 81 mg via ORAL
  Filled 2020-06-16: qty 1

## 2020-06-16 MED ORDER — LEVOTHYROXINE SODIUM 25 MCG PO TABS
25.0000 ug | ORAL_TABLET | Freq: Every day | ORAL | Status: DC
  Administered 2020-06-17: 25 ug via ORAL
  Filled 2020-06-16: qty 1

## 2020-06-16 MED ORDER — MORPHINE SULFATE (PF) 4 MG/ML IV SOLN
4.0000 mg | Freq: Once | INTRAVENOUS | Status: AC
  Administered 2020-06-16: 4 mg via INTRAVENOUS
  Filled 2020-06-16: qty 1

## 2020-06-16 NOTE — ED Triage Notes (Signed)
Slipped and fell in kitchen on water, normal c/s to all extremeties, decreased movement to left leg  Due to pain

## 2020-06-16 NOTE — ED Provider Notes (Signed)
[facility] EMERGENCY DEPARTMENT Provider Note   CSN: [id number] Arrival date & time: 06/16/20  1728     History Chief Complaint  Patient presents with  . Fall    Slipped in water, fell in kitchen on vinyl flooring, no loc, not on blood thinners, left hip pain, no rotation or shortening    Patricia Chambers is a 70 y.o. female w/ hypothyroidism presenting with fall and left hip pain.  She slipped on water today in her kitchen, hyperflexed her left leg, falling forward onto her hands.  She had pain in her left hip radiating to her left knee afterwards, worse with standing, could not bear weight.  EMS gave her 50 mcg fentanyl en route.  Pain is tolerate while at rest but hurts with any movement.  Never had this issue before. No hx of pelvic fx or surgery.  Follows with [facility] for ankle fx last year.  Reports paresthesias in her left foot.  Here with her neighbor and friend.  She lives alone and has a dog in the house.  Takes synthyroid and preventative 81 mg aspirin only as a daily medicine.  Reports no other medical issues.  HPI     Past Medical History:  Diagnosis Date  . Asthma   . Complication of anesthesia    hard to wake up  . FHx: migraine headaches   . Hx of colonic polyps   . Hypertension   . PONV (postoperative nausea and vomiting)   . Sarcoidosis     Patient Active Problem List   Diagnosis Date Noted  . Atypical chest pain 12/20/2018  . DOE (dyspnea on exertion) 11/07/2018  . Essential hypertension 06/21/2013  . Dyslipidemia 06/21/2013  . Family history of coronary artery disease 06/21/2013  . Chest pain with moderate risk of acute coronary syndrome 06/20/2013    Past Surgical History:  Procedure Laterality Date  . LEFT HEART CATHETERIZATION WITH CORONARY ANGIOGRAM N/A 06/24/2013   Procedure: LEFT HEART CATHETERIZATION WITH CORONARY ANGIOGRAM;  Surgeon: Blane Ohara, MD;  Location: [facility] CATH LAB;  Service: Cardiovascular;  Laterality:  N/A;  . TEMPOROMANDIBULAR JOINT SURGERY       OB History   No obstetric history on file.     No family history on file.  Social History   Tobacco Use  . Smoking status: Former Smoker    Packs/day: 1.00    Years: 5.00    Pack years: 5.00    Types: Cigarettes    Quit date: 03/28/1974    Years since quitting: 46.2  . Smokeless tobacco: Never Used  Vaping Use  . Vaping Use: Former  Substance Use Topics  . Alcohol use: Yes    Alcohol/week: 5.0 standard drinks    Types: 5 Glasses of wine per week    Comment: Or more  . Drug use: No    Home Medications Prior to Admission medications   Medication Sig Start Date End Date Taking? Authorizing Provider  acetaminophen (TYLENOL) 500 MG tablet Take 500-1,000 mg by mouth every 6 (six) hours as needed for moderate pain or headache.   Yes [provider]  Ascorbic Acid (VITAMIN C) 500 MG tablet Take 500 mg by mouth daily.   Yes [provider]  diclofenac Sodium (VOLTAREN) 1 % GEL Apply 2 g topically at bedtime as needed (pain).   Yes [provider]  fluticasone (FLONASE) 50 MCG/ACT nasal spray Place 2 sprays into both nostrils daily as needed for allergies or rhinitis.  Yes [provider]  hydroxypropyl methylcellulose / hypromellose (ISOPTO TEARS / GONIOVISC) 2.5 % ophthalmic solution Place 2 drops into both eyes in the morning and at bedtime.   Yes [provider]  levothyroxine (SYNTHROID) 25 MCG tablet Take 25 mcg by mouth daily before breakfast.   Yes [provider]  Multiple Vitamin (MULTIVITAMIN) capsule Take 1 capsule by mouth daily.   Yes [provider]    Allergies    Naproxen and Penicillins  Review of Systems   Review of Systems  Constitutional: Negative for chills and fever.  Eyes: Negative for visual disturbance.  Respiratory: Negative for cough and shortness of breath.   Cardiovascular: Negative for chest pain and palpitations.  Gastrointestinal:  Negative for abdominal pain and vomiting.  Genitourinary: Negative for dysuria and hematuria.  Musculoskeletal: Positive for arthralgias and myalgias.  Skin: Negative for color change and rash.  Neurological: Positive for numbness. Negative for syncope.  Psychiatric/Behavioral: Negative for agitation and confusion.  All other systems reviewed and are negative.   Physical Exam Updated Vital Signs BP 118/62   Pulse 78   Temp 98 F (36.7 C) (Oral)   Resp 15   Ht 5\' 7"  (1.702 m)   Wt 86.2 kg   SpO2 94%   BMI 29.76 kg/m   Physical Exam Constitutional:      General: She is not in acute distress. HENT:     Head: Normocephalic and atraumatic.  Eyes:     Conjunctiva/sclera: Conjunctivae normal.     Pupils: Pupils are equal, round, and reactive to light.  Cardiovascular:     Rate and Rhythm: Regular rhythm. Tachycardia present.     Comments: HR 103, occasional PVC Pulmonary:     Effort: Pulmonary effort is normal. No respiratory distress.  Abdominal:     General: There is no distension.     Tenderness: There is no abdominal tenderness.  Musculoskeletal:     Comments: No spinal midline tenderness Pain in left hip with full active extension of the left lower leg No focal femur or patellar ttp No gross deformity of the hip No pelvic instability  Skin:    General: Skin is warm and dry.     Coloration: Skin is not pale.  Neurological:     General: No focal deficit present.     Mental Status: She is alert and oriented to person, place, and time. Mental status is at baseline.     Sensory: No sensory deficit.     Motor: No weakness.  Psychiatric:        Mood and Affect: Mood normal.        Behavior: Behavior normal.     ED Results / Procedures / Treatments   Labs (all labs ordered are listed, but only abnormal results are displayed) Labs Reviewed  BASIC METABOLIC PANEL - Abnormal; Notable for the following components:      Result Value   Potassium 3.4 (*)    Glucose,  Bld 115 (*)    All other components within normal limits  RESP PANEL BY RT-PCR (FLU A&B, COVID) ARPGX2  CBC    EKG EKG Interpretation  Date/Time:  Tuesday June 16 2020 17:35:45 EDT Ventricular Rate:  108 PR Interval:    QRS Duration: 93 QT Interval:  372 QTC Calculation: 499 R Axis:     Text Interpretation: Sinus tachycardia Borderline prolonged QT interval No STEMI Confirmed by Octaviano Glow [phone]) on 06/16/2020 5:54:07 PM Also confirmed by Octaviano Glow [phone]), editor Oswaldo Milian,  [place] ([id number])  on 06/17/2020 8:50:24 AM   Radiology CT PELVIS WO CONTRAST  Result Date: 06/16/2020 CLINICAL DATA:  Lateral pelvic pain after a fall today. EXAM: CT PELVIS WITHOUT CONTRAST TECHNIQUE: Multidetector CT imaging of the pelvis was performed following the standard protocol without intravenous contrast. COMPARISON:  Femur radiographs of earlier today. FINDINGS: Urinary Tract: No distal hydroureter. Normal noncontrast appearance of the bladder. Bowel: Normal large and small bowel loops, imaged terminal ileum, appendix. Vascular/Lymphatic: Aortic and branch vessel atherosclerosis. No pelvic sidewall adenopathy. Reproductive:  Normal uterus and adnexa. Other: No significant free fluid. Mild pelvic floor laxity. Bilateral small fat containing inguinal hernias. No soft tissue hematoma. Musculoskeletal: No acute fracture or dislocation. Right acetabular sclerotic lesion is less than a cm and likely a bone island. IMPRESSION: No acute or posttraumatic deformity identified. Aortic Atherosclerosis (ICD10-I70.0). Electronically Signed   By: Abigail Miyamoto M.D.   On: 06/16/2020 19:44   MR PELVIS WO CONTRAST  Result Date: 06/17/2020 CLINICAL DATA:  Fall, posterior leg pain, difficulty ambulating EXAM: MRI PELVIS WITHOUT CONTRAST TECHNIQUE: Multiplanar multisequence MR imaging of the pelvis was performed. No intravenous contrast was administered. COMPARISON:  CT pelvis 06/16/2020 FINDINGS: Urinary Tract:   Unremarkable Bowel:  Sigmoid colon diverticulosis Vascular/Lymphatic: Unremarkable Reproductive:  Unremarkable Other:  No supplemental non-categorized findings. Musculoskeletal: Ruptured proximal hamstring tendon, with most of the tendon retracted about 3.9 cm distally as shown on image 14 series 9. As expected there is surrounding edema signal some of which tracks along the sciatic nerve. Upper normal amount of fluid in the hip joints bilaterally. Trace contralateral (right) trochanteric bursitis. Lower lumbar degenerative facet arthropathy with a suspected synovial cyst posterior to the right L5-S1 facet joint. Small bilateral indirect inguinal hernias contain adipose tissue. IMPRESSION: 1. Ruptured left proximal hamstring tendon, with most of the tendon retracted about 3.9 cm distally. 2. Trace contralateral (right) trochanteric bursitis. 3. Lower lumbar degenerative facet arthropathy with a suspected synovial cyst posterior to the right L5-S1 facet joint. 4. Sigmoid colon diverticulosis. 5. Small bilateral indirect inguinal hernias contain adipose tissue. Electronically Signed   By: Van Clines M.D.   On: 06/17/2020 08:02   DG FEMUR PORT MIN 2 VIEWS LEFT  Result Date: 06/16/2020 CLINICAL DATA:  Fall EXAM: LEFT FEMUR PORTABLE 2 VIEWS COMPARISON:  None. FINDINGS: There is no evidence of fracture or other focal bone lesions. Soft tissues are unremarkable. IMPRESSION: Negative. Electronically Signed   By: Rolm Baptise M.D.   On: 06/16/2020 18:56    Procedures Procedures   Medications Ordered in ED Medications  levothyroxine (SYNTHROID) tablet 25 mcg (25 mcg Oral Given 06/17/20 0737)  acetaminophen (TYLENOL) tablet 500-1,000 mg (has no administration in time range)  methocarbamol (ROBAXIN) tablet 500 mg (500 mg Oral Given 06/17/20 0737)  morphine 4 MG/ML injection 4 mg (4 mg Intravenous Given 06/16/20 1820)  ondansetron (ZOFRAN) injection 4 mg (4 mg Intravenous Given 06/16/20 1820)  LORazepam  (ATIVAN) injection 1 mg (1 mg Intravenous Given 06/17/20 0144)  aspirin chewable tablet 81 mg (81 mg Oral Given 06/17/20 0737)  oxyCODONE-acetaminophen (PERCOCET/ROXICET) 5-325 MG per tablet 2 tablet (2 tablets Oral Given 06/17/20 1497)    ED Course  I have reviewed the triage vital signs and the nursing notes.  Pertinent labs & imaging results that were available during my care of the patient were reviewed by me and considered in my medical decision making (see chart for details).  DDx includes pelvic or hip fx vs muscle tear or injury Posterior  leg most tender with extension - may be a hamstring injury  Otherwise stable and well appearing Neurovascularly intact  Xray and CT reviewed - no acute fx identified Labs reviewed - no noteable findings  IV morphine and zofran ordered for pain and nausea, with improvement of pain.  We attempted to ambulate her with walker and crutches but were unable to do so.  Therefore I've ordered an MRI to evaluate for muscle injury or occult hip fx, and also a PT evaluation and CM consult given her independent living situation.  Patient in agreement with plan.  Morning meds ordered.  Clinical Course as of 06/17/20 1050  Tue Jun 16, 2020  2045 No acute fx, pain improved, good ROM on reassessment.  She may have a muscle tear.  We'll try to ambulate with a walker and/or crutches [MT]    Clinical Course User Index [MT] Johnnay Pleitez, Carola Rhine, MD    Final Clinical Impression(s) / ED Diagnoses Final diagnoses:  Fall, initial encounter  Hamstring tear    Rx / DC Orders ED Discharge Orders    None       Wyvonnia Dusky, MD 06/17/20 1050

## 2020-06-16 NOTE — ED Notes (Signed)
Attempted to ambulate pt with walker and crutches. Pt did well walking with 4-point walker, but had several spasm episodes throughout ambulation that almost ended in a fall. Attempted crutches as well. Pt was unable to utilize crutches.

## 2020-06-17 ENCOUNTER — Other Ambulatory Visit: Payer: Self-pay

## 2020-06-17 ENCOUNTER — Emergency Department (HOSPITAL_COMMUNITY): Payer: Medicare Other

## 2020-06-17 LAB — RESP PANEL BY RT-PCR (FLU A&B, COVID) ARPGX2
Influenza A by PCR: NEGATIVE
Influenza B by PCR: NEGATIVE
SARS Coronavirus 2 by RT PCR: NEGATIVE

## 2020-06-17 MED ORDER — OXYCODONE-ACETAMINOPHEN 5-325 MG PO TABS
1.0000 | ORAL_TABLET | Freq: Once | ORAL | Status: AC
Start: 2020-06-17 — End: 2020-06-17
  Administered 2020-06-17: 1 via ORAL
  Filled 2020-06-17: qty 1

## 2020-06-17 MED ORDER — DOCUSATE SODIUM 100 MG PO CAPS: 100.0000 mg | ORAL_CAPSULE | Freq: Two times a day (BID) | ORAL | 0 refills | Status: DC

## 2020-06-17 MED ORDER — OXYCODONE-ACETAMINOPHEN 5-325 MG PO TABS
2.0000 | ORAL_TABLET | Freq: Once | ORAL | Status: AC
  Administered 2020-06-17: 2 via ORAL
  Filled 2020-06-17: qty 2

## 2020-06-17 MED ORDER — OXYCODONE-ACETAMINOPHEN 5-325 MG PO TABS: 1.0000 | ORAL_TABLET | Freq: Four times a day (QID) | ORAL | 0 refills | Status: DC | PRN

## 2020-06-17 MED ORDER — LEVOTHYROXINE SODIUM 25 MCG PO TABS: 25.0000 ug | ORAL_TABLET | Freq: Every day | ORAL | Status: DC

## 2020-06-17 MED ORDER — ACETAMINOPHEN 500 MG PO TABS: 500.0000 mg | ORAL_TABLET | Freq: Four times a day (QID) | ORAL | Status: DC | PRN

## 2020-06-17 MED ORDER — METHOCARBAMOL 500 MG PO TABS
500.0000 mg | ORAL_TABLET | Freq: Two times a day (BID) | ORAL | Status: DC
  Administered 2020-06-17: 500 mg via ORAL
  Filled 2020-06-17: qty 1

## 2020-06-17 NOTE — Discharge Instructions (Addendum)
1.  You may take 1-2 Percocet every 6 hours as needed for pain control.  Try to apply well wrapped ice packs to the area of the swelling and pain. 2.  Make an appointment with [facility] orthopedic physician as soon as possible. 3.  You will be getting a walker and a home bedside commode from home health. 4.  Return to emergency department if you have worsening problems or symptoms.

## 2020-06-17 NOTE — ED Provider Notes (Signed)
I assumed care in signout to follow-up on MRI.  No acute fracture noted but likely has hamstring injury.  Patient denies any other complaints.  Patient had very difficult time ambulating due to severe pain.  She also must travel over steps to get home.  Plan is to have case management, physical therapy see patient in the morning.  Patient did report spasms in her leg, will add on muscle relaxant Anticipate discharge after seeing physical therapy.  Patient is already established with [facility] for follow-up   Ripley Fraise, MD 06/17/20 [phone]

## 2020-06-17 NOTE — ED Provider Notes (Signed)
Patient does not have significant pain at rest.  She is alert and nontoxic. Physical Exam  BP 112/65   Pulse 83   Temp 98 F (36.7 C) (Oral)   Resp 14   Ht 5\' 7"  (1.702 m)   Wt 86.2 kg   SpO2 93%   BMI 29.76 kg/m   Physical Exam Constitutional:      Comments: Alert and nontoxic.  No respiratory distress.  Comfortable at rest.  Cardiovascular:     Rate and Rhythm: Normal rate and regular rhythm.  Pulmonary:     Effort: Pulmonary effort is normal.     Breath sounds: Normal breath sounds.  Musculoskeletal:     Comments: Left lower extremity no edema, calf is soft and nontender.  Foot is warm and dry.  Neurological:     General: No focal deficit present.     Mental Status: She is oriented to person, place, and time.     Coordination: Coordination normal.  Psychiatric:        Mood and Affect: Mood normal.     ED Course/Procedures   Clinical Course as of 06/17/20 1147  Tue Jun 16, 2020  2045 No acute fx, pain improved, good ROM on reassessment.  She may have a muscle tear.  We'll try to ambulate with a walker and/or crutches [MT]    Clinical Course User Index [MT] Trifan, Carola Rhine, MD    Procedures  MDM  Patient was awaiting PT assessment.  She has been able to ambulate effectively with assistance with walker to manage at home.  She was given 2 Percocet tablets.  She tolerated these well.  No oversedation or confusion.  Patient is seen at [facility] by Dr. Mayer Camel for hip bursitis.  She is recommended to call them for recheck ASAP for proximal hamstring tendon rupture.  Patient is counseled on taking Colace and MiraLAX to avoid constipation with narcotic pain control.     Charlesetta Shanks, MD 06/17/20 1149

## 2020-06-17 NOTE — Evaluation (Signed)
Physical Therapy Evaluation Patient Details Name: Patricia Chambers MRN: [id number] DOB: 18-Apr-1950 Today's Date: 06/17/2020   History of Present Illness  70 yo female with onset of fall in awkward posture on 06/16/20, was given MRI with findings of l proximal hamstring tendon rupture with 3.9 cm tendon retracted distally.  Note also R trochanteric bursitis, inguinal hernias with fat incarceration and diverticulosis.  PMHx:  asthma, migraines, heart cath, HLD, CAD, chest pain, HTN, DOE, sarcoidosis, polyps in colon  Clinical Impression  Pt was seen fo mobility on RW with help to get up and walk.  Her effort is good and reports pain control with the meds pre walk.  Pt is able to move fairly well, and will have her son home with her now to support care and give her time to see how ortho plans to proceed with the injury to L prox hamstring tendon.  Follow for acute PT needs if not discharged today.    Follow Up Recommendations Home health PT;Supervision for mobility/OOB    Equipment Recommendations  Rolling walker with 5" wheels;3in1 (PT)    Recommendations for Other Services       Precautions / Restrictions Precautions Precautions: Fall Precaution Comments: recent fall at home Restrictions Weight Bearing Restrictions: No Other Position/Activity Restrictions: care for L hamstring tendon rupture      Mobility  Bed Mobility Overal bed mobility: Needs Assistance Bed Mobility: Supine to Sit;Sit to Supine     Supine to sit: Supervision;Min guard Sit to supine: Min assist (just to support LLE)        Transfers Overall transfer level: Needs assistance Equipment used: Rolling walker (2 wheeled);1 person hand held assist Transfers: Sit to/from Stand Sit to Stand: Min guard (for safety)         General transfer comment: min guard to steady  Ambulation/Gait Ambulation/Gait assistance: Min guard Gait Distance (Feet): 100 Feet Assistive device: Rolling walker (2 wheeled);1 person hand  held assist Gait Pattern/deviations: Step-to pattern;Step-through pattern;Decreased stride length;Decreased weight shift to left;Wide base of support (walking on toes on LLE) Gait velocity: reduced   General Gait Details: modified her gait with toe walking on LLE to ease use of hamstrings  Stairs Stairs: Yes Stairs assistance: Min guard Stair Management: One rail Right;Two rails;Step to pattern;Forwards;Backwards Number of Stairs: 1 General stair comments: practiced technique  Wheelchair Mobility    Modified Rankin (Stroke Patients Only)       Balance Overall balance assessment: Needs assistance Sitting-balance support: Feet supported Sitting balance-Leahy Scale: Good     Standing balance support: Bilateral upper extremity supported;During functional activity Standing balance-Leahy Scale: Poor                               Pertinent Vitals/Pain Pain Assessment: Faces Faces Pain Scale: Hurts little more Pain Location: L hamstring Pain Descriptors / Indicators: Guarding;Tender;Tightness Pain Intervention(s): Limited activity within patient's tolerance;Monitored during session;Premedicated before session;Repositioned    Home Living Family/patient expects to be discharged to:: Private residence Living Arrangements: Children Available Help at Discharge: Family;Available PRN/intermittently;Available 24 hours/day Type of Home: House Home Access: Stairs to enter Entrance Stairs-Rails: Right;Left;Can reach both Entrance Stairs-Number of Steps: 4 Home Layout: One level Home Equipment: Walker - 4 wheels;Wheelchair - manual Additional Comments: requires RW and BSC    Prior Function Level of Independence: Independent         Comments: has been walking with no assistance     Hand Dominance  Dominant Hand: Right    Extremity/Trunk Assessment   Upper Extremity Assessment Upper Extremity Assessment: Overall WFL for tasks assessed    Lower Extremity  Assessment Lower Extremity Assessment: LLE deficits/detail LLE Deficits / Details: pain and weakness in L hamstring LLE: Unable to fully assess due to pain LLE Coordination: decreased gross motor    Cervical / Trunk Assessment Cervical / Trunk Assessment: Normal  Communication   Communication: No difficulties  Cognition Arousal/Alertness: Awake/alert Behavior During Therapy: WFL for tasks assessed/performed Overall Cognitive Status: Within Functional Limits for tasks assessed                                 General Comments: has pain but comfort with meds compared to premedication      General Comments General comments (skin integrity, edema, etc.): pt was assisted to stand and walk, and does not sit comfortably afterward.  Best to stand or recline    Exercises     Assessment/Plan    PT Assessment Patient needs continued PT services  PT Problem List Decreased strength;Decreased range of motion;Decreased activity tolerance;Decreased balance;Decreased mobility;Decreased coordination;Decreased knowledge of use of DME;Decreased knowledge of precautions;Pain       PT Treatment Interventions DME instruction;Gait training;Stair training;Functional mobility training;Therapeutic activities;Therapeutic exercise;Patient/family education;Neuromuscular re-education;Balance training    PT Goals (Current goals can be found in the Care Plan section)  Acute Rehab PT Goals Patient Stated Goal: to walk and hurt less PT Goal Formulation: With patient Time For Goal Achievement: 06/30/20 Potential to Achieve Goals: Good    Frequency Min 3X/week   Barriers to discharge Inaccessible home environment;Decreased caregiver support home alone with stairs to enter    Co-evaluation               AM-PAC PT "6 Clicks" Mobility  Outcome Measure Help needed turning from your back to your side while in a flat bed without using bedrails?: None Help needed moving from lying on your  back to sitting on the side of a flat bed without using bedrails?: A Little Help needed moving to and from a bed to a chair (including a wheelchair)?: A Little Help needed standing up from a chair using your arms (e.g., wheelchair or bedside chair)?: A Little Help needed to walk in hospital room?: A Little Help needed climbing 3-5 steps with a railing? : A Lot 6 Click Score: 18    End of Session Equipment Utilized During Treatment: Gait belt Activity Tolerance: Patient limited by fatigue;Patient limited by pain Patient left: in bed;with call bell/phone within reach Nurse Communication: Mobility status PT Visit Diagnosis: Unsteadiness on feet (R26.81);Ataxic gait (R26.0);Pain Pain - Right/Left: Left Pain - part of body: Hip    Time: 1032-1100 PT Time Calculation (min) (ACUTE ONLY): 28 min   Charges:   PT Evaluation $PT Eval Moderate Complexity: 1 Mod PT Treatments $Gait Training: 8-22 mins       Ramond Dial 06/17/2020, 12:17 PM Mee Hives, PT MS Acute Rehab Dept. Number: [place] and [place]

## 2020-06-17 NOTE — ED Notes (Signed)
Patient transported to MRI 

## 2020-07-31 ENCOUNTER — Ambulatory Visit (INDEPENDENT_AMBULATORY_CARE_PROVIDER_SITE_OTHER): Payer: Medicare Other | Admitting: Gastroenterology

## 2020-07-31 ENCOUNTER — Encounter: Payer: Self-pay | Admitting: Gastroenterology

## 2020-07-31 VITALS — BP 130/80 | HR 86 | Ht 67.0 in | Wt 192.0 lb

## 2020-07-31 DIAGNOSIS — Z1211 Encounter for screening for malignant neoplasm of colon: Secondary | ICD-10-CM

## 2020-07-31 DIAGNOSIS — Z8719 Personal history of other diseases of the digestive system: Secondary | ICD-10-CM | POA: Diagnosis not present

## 2020-07-31 DIAGNOSIS — R1011 Right upper quadrant pain: Secondary | ICD-10-CM

## 2020-07-31 MED ORDER — PLENVU 140 G PO SOLR: ORAL | 0 refills | Status: DC

## 2020-07-31 NOTE — Progress Notes (Signed)
Patricia Chambers    [id number]    1950-05-21  Primary Care Physician:Webb, Arbie Cookey, MD  Referring Physician: Maurice Small, MD [place] [address] [place],  [place] [address]   Chief complaint: Colon cancer screening, history of diverticulitis  HPI: 70 year old very pleasant female here for new patient visit.  She was followed by Dr. Olevia Perches, last seen in 2012 Colonoscopy June 22, 2010: Moderate diverticulosis throughout colon otherwise normal exam.  She had episode of diverticulitis last year, was treated with course of antibiotics with improvement of symptoms. She is due for colorectal cancer screening  Review of system positive for abdominal bloating, excessive gas and intermittent nausea. No dysphagia, melena or rectal bleeding.  Outpatient Encounter Medications as of 07/31/2020  Medication Sig  . acetaminophen (TYLENOL) 500 MG tablet Take 500-1,000 mg by mouth every 6 (six) hours as needed for moderate pain or headache.  . Ascorbic Acid (VITAMIN C) 500 MG tablet Take 500 mg by mouth daily.  . diclofenac Sodium (VOLTAREN) 1 % GEL Apply 2 g topically at bedtime as needed (pain).  . fluticasone (FLONASE) 50 MCG/ACT nasal spray Place 2 sprays into both nostrils daily as needed for allergies or rhinitis.  . hydroxypropyl methylcellulose / hypromellose (ISOPTO TEARS / GONIOVISC) 2.5 % ophthalmic solution Place 2 drops into both eyes in the morning and at bedtime.  Marland Kitchen levothyroxine (SYNTHROID) 25 MCG tablet Take 25 mcg by mouth daily before breakfast.  . methocarbamol (ROBAXIN) 500 MG tablet SMARTSIG:1-2 Tablet(s) By Mouth 2-3 Times Daily PRN  . Multiple Vitamin (MULTIVITAMIN) capsule Take 1 capsule by mouth daily.  . [DISCONTINUED] docusate sodium (COLACE) 100 MG capsule Take 1 capsule (100 mg total) by mouth every 12 (twelve) hours. (Patient not taking: Reported on 07/31/2020)  . [DISCONTINUED] oxyCODONE-acetaminophen (PERCOCET) 5-325 MG tablet Take 1-2 tablets by mouth  every 6 (six) hours as needed. (Patient not taking: Reported on 07/31/2020)   No facility-administered encounter medications on file as of 07/31/2020.    Allergies as of 07/31/2020 - Review Complete 07/31/2020  Allergen Reaction Noted  . Naproxen  06/20/2013  . Penicillins  06/08/2010    Past Medical History:  Diagnosis Date  . Asthma   . Colon polyp    patient states she had 1 removed 20 years ago  . Complication of anesthesia    hard to wake up  . FHx: migraine headaches   . Hx of colonic polyps   . Hypertension   . PONV (postoperative nausea and vomiting)   . Sarcoidosis     Past Surgical History:  Procedure Laterality Date  . LEFT HEART CATHETERIZATION WITH CORONARY ANGIOGRAM N/A 06/24/2013   Procedure: LEFT HEART CATHETERIZATION WITH CORONARY ANGIOGRAM;  Surgeon: Blane Ohara, MD;  Location[facility] C[facility] LAB;  Service: Cardiovascular;  Laterality: N/A;  . TEMPOROMANDIBULAR JOINT SURGERY      Family History  Problem Relation Age of Onset  . Diverticulitis Mother   . Heart disease Mother   . Diverticulitis Brother   . Colon cancer Neg Hx   . Pancreatic cancer Neg Hx   . Esophageal cancer Neg Hx     Social History   Socioeconomic History  . Marital status: Widowed    Spouse name: Not on file  . Number of children: Not on file  . Years of education: Not on file  . Highest education level: Not on file  Occupational History  . Not on file  Tobacco Use  .  Smoking status: Former Smoker    Packs/day: 1.00    Years: 5.00    Pack years: 5.00    Types: Cigarettes    Quit date: 03/28/1974    Years since quitting: 46.3  . Smokeless tobacco: Never Used  Vaping Use  . Vaping Use: Former  Substance and Sexual Activity  . Alcohol use: Yes    Alcohol/week: 5.0 standard drinks    Types: 5 Glasses of wine per week    Comment: Or more  . Drug use: No  . Sexual activity: Not on file  Other Topics Concern  . Not on file  Social History Narrative  . Not on file   Social  Determinants of Health   Financial Resource Strain: Not on file  Food Insecurity: Not on file  Transportation Needs: Not on file  Physical Activity: Not on file  Stress: Not on file  Social Connections: Not on file  Intimate Partner Violence: Not on file      Review of systems: All other review of systems negative except as mentioned in the HPI.   Physical Exam: Vitals:   07/31/20 1004  BP: 130/80  Pulse: 86  SpO2: 97%   Body mass index is 30.07 kg/m. Gen:      No acute distress HEENT:  sclera anicteric Abd:      soft, non-tender; no palpable masses, no distension Ext:    No edema Neuro: alert and oriented x 3 Psych: normal mood and affect  Data Reviewed:  Reviewed labs, radiology imaging, old records and pertinent past GI work up   Assessment and Plan/Recommendations:  70 year old very pleasant female here to discuss colorectal cancer screening, has history of diverticulitis Will schedule colonoscopy for further evaluation given recent episode of diverticulitis and also is past due for recall colonoscopy for colorectal cancer screening Increase dietary fiber and water intake  Intermittent nausea, abdominal bloating: We will obtain abdominal ultrasound to exclude gallbladder disease or any acute abdominal pathology Return after colonoscopy as needed  The risks and benefits as well as alternatives of endoscopic procedure(s) have been discussed and reviewed. All questions answered. The patient agrees to proceed.    Damaris Hippo , MD    CC: Maurice Small, MD

## 2020-07-31 NOTE — Patient Instructions (Signed)
You have been scheduled for an abdominal ultrasound at [facility] (1st floor of hospital) on 08/05/2020 at [place]. Please arrive 15 minutes prior to your appointment for registration. Make certain not to have anything to eat or drink 8  hours prior to your appointment. Should you need to reschedule your appointment, please contact radiology at [phone]. This test typically takes about 30 minutes to perform.  You have been scheduled for a colonoscopy. Please follow written instructions given to you at your visit today.  Please pick up your prep supplies at the pharmacy within the next 1-3 days. If you use inhalers (even only as needed), please bring them with you on the day of your procedure.   Due to recent changes in healthcare laws, you may see the results of your imaging and laboratory studies on MyChart before your provider has had a chance to review them.  We understand that in some cases there may be results that are confusing or concerning to you. Not all laboratory results come back in the same time frame and the provider may be waiting for multiple results in order to interpret others.  Please give [place] 48 hours in order for your provider to thoroughly review all the results before contacting the office for clarification of your results.   I appreciate the  opportunity to care for you  Thank You   Harl Bowie , MD

## 2020-08-05 ENCOUNTER — Ambulatory Visit (HOSPITAL_COMMUNITY)
Admission: RE | Admit: 2020-08-05 | Discharge: 2020-08-05 | Disposition: A | Discharge status: Home / Self Care | Payer: Medicare Other | Source: Ambulatory Visit | Attending: Gastroenterology | Admitting: Gastroenterology

## 2020-08-05 ENCOUNTER — Other Ambulatory Visit: Payer: Self-pay

## 2020-08-05 DIAGNOSIS — R1011 Right upper quadrant pain: Secondary | ICD-10-CM | POA: Diagnosis present

## 2020-08-05 DIAGNOSIS — Z8719 Personal history of other diseases of the digestive system: Secondary | ICD-10-CM | POA: Diagnosis present

## 2020-08-10 ENCOUNTER — Encounter: Payer: Self-pay | Admitting: Gastroenterology

## 2020-10-22 ENCOUNTER — Encounter: Payer: Self-pay | Admitting: Gastroenterology

## 2020-10-22 ENCOUNTER — Other Ambulatory Visit: Payer: Self-pay

## 2020-10-22 ENCOUNTER — Ambulatory Visit (AMBULATORY_SURGERY_CENTER): Payer: Medicare Other | Admitting: Gastroenterology

## 2020-10-22 VITALS — BP 144/83 | HR 71 | Temp 97.3°F | Resp 14 | Ht 67.0 in | Wt 192.0 lb

## 2020-10-22 DIAGNOSIS — Z1211 Encounter for screening for malignant neoplasm of colon: Secondary | ICD-10-CM

## 2020-10-22 DIAGNOSIS — D123 Benign neoplasm of transverse colon: Secondary | ICD-10-CM

## 2020-10-22 DIAGNOSIS — K635 Polyp of colon: Secondary | ICD-10-CM | POA: Diagnosis not present

## 2020-10-22 MED ORDER — SODIUM CHLORIDE 0.9 % IV SOLN
500.0000 mL | Freq: Once | INTRAVENOUS | Status: DC
Start: 2020-10-22 — End: 2020-10-22

## 2020-10-22 NOTE — Progress Notes (Signed)
Called to room to assist during endoscopic procedure.  Patient ID and intended procedure confirmed with present staff. Received instructions for my participation in the procedure from the performing physician.  

## 2020-10-22 NOTE — Progress Notes (Signed)
Medical history reviewed with no changes noted. VS assessed by C.W 

## 2020-10-22 NOTE — Op Note (Signed)
[facility] Patient Name: Patricia Chambers Procedure Date: 10/22/2020 10:34 AM MRN: [id number] Endoscopist: Mauri Pole , MD Age: 70 Referring MD:  Date of Birth: 1950-08-24 Gender: Female Account #: [id number] Procedure:                Colonoscopy Indications:              Screening for colorectal malignant neoplasm Medicines:                Monitored Anesthesia Care Procedure:                Pre-Anesthesia Assessment:                           - Prior to the procedure, a History and Physical                            was performed, and patient medications and                            allergies were reviewed. The patient's tolerance of                            previous anesthesia was also reviewed. The risks                            and benefits of the procedure and the sedation                            options and risks were discussed with the patient.                            All questions were answered, and informed consent                            was obtained. Prior Anticoagulants: The patient has                            taken no previous anticoagulant or antiplatelet                            agents. ASA Grade Assessment: II - A patient with                            mild systemic disease. After reviewing the risks                            and benefits, the patient was deemed in                            satisfactory condition to undergo the procedure.                           After obtaining informed consent, the colonoscope  was passed under direct vision. Throughout the                            procedure, the patient's blood pressure, pulse, and                            oxygen saturations were monitored continuously. The                            0405 PCF-H190TL Slim SB Colonoscope was introduced                            through the anus and advanced to the the cecum,                            identified  by appendiceal orifice and ileocecal                            valve. The colonoscopy was performed without                            difficulty. The patient tolerated the procedure                            well. The quality of the bowel preparation was                            good. The ileocecal valve, appendiceal orifice, and                            rectum were photographed. Scope In: 10:36:28 AM Scope Out: 10:56:15 AM Scope Withdrawal Time: 0 hours 15 minutes 48 seconds  Total Procedure Duration: 0 hours 19 minutes 47 seconds  Findings:                 The perianal and digital rectal examinations were                            normal.                           Five sessile polyps were found in the transverse                            colon. The polyps were 5 to 9 mm in size. These                            polyps were removed with a cold snare. Resection                            and retrieval were complete.                           A 20 mm polyp was found in the transverse colon.  The polyp was granular lateral spreading. Area was                            tattooed with an injection of 1 mL of Spot (carbon                            black). Polypectomy was not attempted.                           Scattered small and large-mouthed diverticula were                            found in the sigmoid colon, descending colon,                            transverse colon and ascending colon.                           Non-bleeding external and internal hemorrhoids were                            found during retroflexion. The hemorrhoids were                            medium-sized. Complications:            No immediate complications. Estimated Blood Loss:     Estimated blood loss was minimal. Impression:               - Five 5 to 9 mm polyps in the transverse colon,                            removed with a cold snare. Resected and retrieved.                            - One 20 mm polyp in the transverse colon.                            Resection not attempted. Tattooed.                           - Moderate diverticulosis in the sigmoid colon, in                            the descending colon, in the transverse colon and                            in the ascending colon.                           - Non-bleeding external and internal hemorrhoids. Recommendation:           - Patient has a contact number available for  emergencies. The signs and symptoms of potential                            delayed complications were discussed with the                            patient. Return to normal activities tomorrow.                            Written discharge instructions were provided to the                            patient.                           - Resume previous diet.                           - Continue present medications.                           - Await pathology results.                           - Repeat colonoscopy at the next available                            appointment for Polypectomy/EMR to be scheduled at                            [facility] endoscopy unit. Mauri Pole, MD 10/22/2020 11:04:25 AM This report has been signed electronically.

## 2020-10-22 NOTE — Progress Notes (Signed)
Report to PACU, RN, vss, BBS= Clear.  

## 2020-10-22 NOTE — Patient Instructions (Signed)
YOU HAD AN ENDOSCOPIC PROCEDURE TODAY AT THE [facility]:   Refer to the procedure report that was given to you for any specific questions about what was found during the examination.  If the procedure report does not answer your questions, please call your gastroenterologist to clarify.  If you requested that your care partner not be given the details of your procedure findings, then the procedure report has been included in a sealed envelope for you to review at your convenience later.  YOU SHOULD EXPECT: Some feelings of bloating in the abdomen. Passage of more gas than usual.  Walking can help get rid of the air that was put into your GI tract during the procedure and reduce the bloating. If you had a lower endoscopy (such as a colonoscopy or flexible sigmoidoscopy) you may notice spotting of blood in your stool or on the toilet paper. If you underwent a bowel prep for your procedure, you may not have a normal bowel movement for a few days.  Please Note:  You might notice some irritation and congestion in your nose or some drainage.  This is from the oxygen used during your procedure.  There is no need for concern and it should clear up in a day or so.  SYMPTOMS TO REPORT IMMEDIATELY:   Following lower endoscopy (colonoscopy or flexible sigmoidoscopy):  Excessive amounts of blood in the stool  Significant tenderness or worsening of abdominal pains  Swelling of the abdomen that is new, acute  Fever of 100F or higher  For urgent or emergent issues, a gastroenterologist can be reached at any hour by calling [phone]. Do not use MyChart messaging for urgent concerns.    DIET:  We do recommend a small meal at first, but then you may proceed to your regular diet.  Drink plenty of fluids but you should avoid alcoholic beverages for 24 hours.  ACTIVITY:  You should plan to take it easy for the rest of today and you should NOT DRIVE or use heavy machinery until tomorrow (because  of the sedation medicines used during the test).    FOLLOW UP: Our staff will call the number listed on your records 48-72 hours following your procedure to check on you and address any questions or concerns that you may have regarding the information given to you following your procedure. If we do not reach you, we will leave a message.  We will attempt to reach you two times.  During this call, we will ask if you have developed any symptoms of COVID 19. If you develop any symptoms (ie: fever, flu-like symptoms, shortness of breath, cough etc.) before then, please call [phone].  If you test positive for Covid 19 in the 2 weeks post procedure, please call and report this information to us.    If any biopsies were taken you will be contacted by phone or by letter within the next 1-3 weeks.  Please call us at [phone] if you have not heard about the biopsies in 3 weeks.    SIGNATURES/CONFIDENTIALITY: You and/or your care partner have signed paperwork which will be entered into your electronic medical record.  These signatures attest to the fact that that the information above on your After Visit Summary has been reviewed and is understood.  Full responsibility of the confidentiality of this discharge information lies with you and/or your care-partner. 

## 2020-10-26 ENCOUNTER — Telehealth: Payer: Self-pay | Admitting: *Deleted

## 2020-10-26 NOTE — Telephone Encounter (Signed)
  Follow up Call-  Call back number 10/22/2020  Post procedure Call Back phone  # [phone]  Permission to leave phone message Yes  Some recent data might be hidden     Patient questions:  Do you have a fever, pain , or abdominal swelling? No. Pain Score  0 *  Have you tolerated food without any problems? Yes.    Have you been able to return to your normal activities? Yes.    Do you have any questions about your discharge instructions: Diet   No. Medications  No. Follow up visit  No.  Do you have questions or concerns about your Care? No.  Actions: * If pain score is 4 or above: No action needed, pain <4.  Have you developed a fever since your procedure? no  2.   Have you had an respiratory symptoms (SOB or cough) since your procedure? no  3.   Have you tested positive for COVID 19 since your procedure no  4.   Have you had any family members/close contacts diagnosed with the COVID 19 since your procedure?  no   If yes to any of these questions please route to Joylene John, RN and Joella Prince, RN

## 2020-11-03 ENCOUNTER — Other Ambulatory Visit: Payer: Self-pay | Admitting: Family Medicine

## 2020-11-03 DIAGNOSIS — E2839 Other primary ovarian failure: Secondary | ICD-10-CM

## 2020-11-10 ENCOUNTER — Other Ambulatory Visit: Payer: Self-pay | Admitting: Family Medicine

## 2020-11-10 DIAGNOSIS — Z1231 Encounter for screening mammogram for malignant neoplasm of breast: Secondary | ICD-10-CM

## 2020-11-11 ENCOUNTER — Other Ambulatory Visit: Payer: Self-pay

## 2020-11-11 DIAGNOSIS — Z86010 Personal history of colonic polyps: Secondary | ICD-10-CM

## 2020-11-11 MED ORDER — PLENVU 140 G PO SOLR: ORAL | 0 refills | Status: DC

## 2020-11-12 ENCOUNTER — Encounter: Payer: Self-pay | Admitting: Gastroenterology

## 2020-12-10 ENCOUNTER — Other Ambulatory Visit: Payer: Self-pay

## 2020-12-10 ENCOUNTER — Telehealth: Payer: Self-pay | Admitting: Gastroenterology

## 2020-12-10 MED ORDER — ONDANSETRON 4 MG PO TBDP: 4.0000 mg | ORAL_TABLET | Freq: Three times a day (TID) | ORAL | 0 refills | Status: DC | PRN

## 2020-12-10 NOTE — Telephone Encounter (Signed)
Spoke with patient in regards to recommendation. Pt is aware that this has been sent to her pharmacy on file. Pt verbalized understanding and had no concerns at the end of the call.

## 2020-12-10 NOTE — Telephone Encounter (Signed)
Please send Rx for Zofran ODT '4mg'$  q8h prn X 6 tabs. Advise patient to take one before starting prep and then take every 8hrs as needed. Thanks

## 2020-12-10 NOTE — Telephone Encounter (Signed)
Spoke with patient, pt is requesting a prescription for antiemetic to take prior to prep for repeat colonoscopy next week. Please advise, thanks.

## 2020-12-10 NOTE — Telephone Encounter (Signed)
Auth# [id number] good 12/17/20-12-21/22

## 2020-12-10 NOTE — Telephone Encounter (Signed)
Inbound call from pt requesting to speak with you regarding a nausea medication that she needs to take before her procedure with Dr. Silverio Decamp. Please advise. Thank you.

## 2020-12-16 NOTE — Anesthesia Preprocedure Evaluation (Addendum)
Anesthesia Evaluation  Patient identified by MRN, date of birth, ID band Patient awake    Reviewed: Allergy & Precautions, NPO status , Patient's Chart, lab work & pertinent test results  History of Anesthesia Complications Negative for: history of anesthetic complications (episode of postop nausea 35 years ago with TMJ surgery, no issues since)  Airway Mallampati: I  TM Distance: >3 FB Neck ROM: Full    Dental no notable dental hx. (+) Dental Advisory Given, Teeth Intact   Pulmonary asthma , former smoker,  Quit smoking 1976, 5 pack year history Well controlled asthma   Pulmonary exam normal breath sounds clear to auscultation       Cardiovascular (-) hypertensionNormal cardiovascular exam Rhythm:Regular Rate:Normal     Neuro/Psych negative neurological ROS  negative psych ROS   GI/Hepatic Neg liver ROS, Colon polyp   Endo/Other  negative endocrine ROS  Renal/GU negative Renal ROS  negative genitourinary   Musculoskeletal  (+) Arthritis , Osteoarthritis,    Abdominal   Peds  Hematology negative hematology ROS (+)   Anesthesia Other Findings   Reproductive/Obstetrics negative OB ROS                           Anesthesia Physical Anesthesia Plan  ASA: 2  Anesthesia Plan: MAC   Post-op Pain Management:    Induction:   PONV Risk Score and Plan: 2 and Propofol infusion and TIVA  Airway Management Planned: Natural Airway and Simple Face Mask  Additional Equipment: None  Intra-op Plan:   Post-operative Plan:   Informed Consent: I have reviewed the patients History and Physical, chart, labs and discussed the procedure including the risks, benefits and alternatives for the proposed anesthesia with the patient or authorized representative who has indicated his/her understanding and acceptance.     Dental advisory given  Plan Discussed with: CRNA  Anesthesia Plan Comments:         Anesthesia Quick Evaluation

## 2020-12-17 ENCOUNTER — Encounter (HOSPITAL_COMMUNITY): Payer: Self-pay | Admitting: Gastroenterology

## 2020-12-17 ENCOUNTER — Ambulatory Visit (HOSPITAL_COMMUNITY): Payer: Medicare Other | Admitting: Anesthesiology

## 2020-12-17 ENCOUNTER — Other Ambulatory Visit: Payer: Self-pay

## 2020-12-17 ENCOUNTER — Encounter (HOSPITAL_COMMUNITY)
Admission: RE | Disposition: A | Discharge status: Home / Self Care | Payer: Self-pay | Source: Home / Self Care | Attending: Gastroenterology

## 2020-12-17 ENCOUNTER — Ambulatory Visit (HOSPITAL_COMMUNITY)
Admission: RE | Admit: 2020-12-17 | Discharge: 2020-12-17 | Disposition: A | Discharge status: Home / Self Care | Payer: Medicare Other | Attending: Gastroenterology | Admitting: Gastroenterology

## 2020-12-17 DIAGNOSIS — Z886 Allergy status to analgesic agent status: Secondary | ICD-10-CM | POA: Diagnosis not present

## 2020-12-17 DIAGNOSIS — E079 Disorder of thyroid, unspecified: Secondary | ICD-10-CM | POA: Insufficient documentation

## 2020-12-17 DIAGNOSIS — I10 Essential (primary) hypertension: Secondary | ICD-10-CM | POA: Diagnosis not present

## 2020-12-17 DIAGNOSIS — Z79890 Hormone replacement therapy (postmenopausal): Secondary | ICD-10-CM | POA: Diagnosis not present

## 2020-12-17 DIAGNOSIS — D123 Benign neoplasm of transverse colon: Secondary | ICD-10-CM | POA: Diagnosis not present

## 2020-12-17 DIAGNOSIS — D126 Benign neoplasm of colon, unspecified: Secondary | ICD-10-CM

## 2020-12-17 DIAGNOSIS — K5730 Diverticulosis of large intestine without perforation or abscess without bleeding: Secondary | ICD-10-CM

## 2020-12-17 DIAGNOSIS — D120 Benign neoplasm of cecum: Secondary | ICD-10-CM | POA: Insufficient documentation

## 2020-12-17 DIAGNOSIS — E785 Hyperlipidemia, unspecified: Secondary | ICD-10-CM | POA: Diagnosis not present

## 2020-12-17 DIAGNOSIS — Z87891 Personal history of nicotine dependence: Secondary | ICD-10-CM | POA: Diagnosis not present

## 2020-12-17 DIAGNOSIS — K648 Other hemorrhoids: Secondary | ICD-10-CM | POA: Insufficient documentation

## 2020-12-17 DIAGNOSIS — K635 Polyp of colon: Secondary | ICD-10-CM

## 2020-12-17 DIAGNOSIS — Z880 Allergy status to penicillin: Secondary | ICD-10-CM | POA: Insufficient documentation

## 2020-12-17 DIAGNOSIS — D122 Benign neoplasm of ascending colon: Secondary | ICD-10-CM | POA: Diagnosis not present

## 2020-12-17 DIAGNOSIS — K644 Residual hemorrhoidal skin tags: Secondary | ICD-10-CM | POA: Diagnosis not present

## 2020-12-17 DIAGNOSIS — Z8379 Family history of other diseases of the digestive system: Secondary | ICD-10-CM | POA: Insufficient documentation

## 2020-12-17 DIAGNOSIS — Z8249 Family history of ischemic heart disease and other diseases of the circulatory system: Secondary | ICD-10-CM | POA: Insufficient documentation

## 2020-12-17 HISTORY — PX: COLONOSCOPY WITH PROPOFOL: SHX5780

## 2020-12-17 HISTORY — PX: ENDOSCOPIC MUCOSAL RESECTION: SHX6839

## 2020-12-17 HISTORY — PX: POLYPECTOMY: SHX5525

## 2020-12-17 HISTORY — PX: SUBMUCOSAL LIFTING INJECTION: SHX6855

## 2020-12-17 SURGERY — COLONOSCOPY WITH PROPOFOL
Anesthesia: Monitor Anesthesia Care

## 2020-12-17 MED ORDER — PROPOFOL 1000 MG/100ML IV EMUL
INTRAVENOUS | Status: AC
  Filled 2020-12-17: qty 100

## 2020-12-17 MED ORDER — METRONIDAZOLE 500 MG PO TABS: 500.0000 mg | ORAL_TABLET | Freq: Three times a day (TID) | ORAL | 0 refills | Status: AC

## 2020-12-17 MED ORDER — LACTATED RINGERS IV SOLN: INTRAVENOUS | Status: DC | PRN

## 2020-12-17 MED ORDER — PROPOFOL 500 MG/50ML IV EMUL
INTRAVENOUS | Status: DC | PRN
  Administered 2020-12-17: 150 ug/kg/min via INTRAVENOUS

## 2020-12-17 MED ORDER — PROPOFOL 10 MG/ML IV BOLUS
INTRAVENOUS | Status: DC | PRN
  Administered 2020-12-17: 20 mg via INTRAVENOUS
  Administered 2020-12-17: 260 mg via INTRAVENOUS
  Administered 2020-12-17: 20 mg via INTRAVENOUS
  Administered 2020-12-17: 40 mg via INTRAVENOUS

## 2020-12-17 MED ORDER — PHENYLEPHRINE 40 MCG/ML (10ML) SYRINGE FOR IV PUSH (FOR BLOOD PRESSURE SUPPORT)
PREFILLED_SYRINGE | INTRAVENOUS | Status: DC | PRN
  Administered 2020-12-17: 120 ug via INTRAVENOUS
  Administered 2020-12-17 (×4): 80 ug via INTRAVENOUS

## 2020-12-17 MED ORDER — LIDOCAINE 2% (20 MG/ML) 5 ML SYRINGE
INTRAMUSCULAR | Status: DC | PRN
  Administered 2020-12-17: 60 mg via INTRAVENOUS

## 2020-12-17 MED ORDER — PROPOFOL 10 MG/ML IV BOLUS
INTRAVENOUS | Status: AC
  Filled 2020-12-17: qty 20

## 2020-12-17 MED ORDER — DEXAMETHASONE SODIUM PHOSPHATE 10 MG/ML IJ SOLN
INTRAMUSCULAR | Status: DC | PRN
  Administered 2020-12-17: 10 mg via INTRAVENOUS

## 2020-12-17 MED ORDER — ONDANSETRON HCL 4 MG/2ML IJ SOLN
INTRAMUSCULAR | Status: DC | PRN
  Administered 2020-12-17: 4 mg via INTRAVENOUS

## 2020-12-17 MED ORDER — SODIUM CHLORIDE 0.9 % IV SOLN: INTRAVENOUS | Status: DC

## 2020-12-17 MED ORDER — CIPROFLOXACIN HCL 500 MG PO TABS: 500.0000 mg | ORAL_TABLET | Freq: Two times a day (BID) | ORAL | 0 refills | Status: AC

## 2020-12-17 SURGICAL SUPPLY — 21 items

## 2020-12-17 NOTE — Interval H&P Note (Signed)
History and Physical Interval Note:  12/17/2020 10:24 AM  Patricia Chambers  has presented today for surgery, with the diagnosis of colon polyp.  The various methods of treatment have been discussed with the patient and family. After consideration of risks, benefits and other options for treatment, the patient has consented to  Procedure(s): COLONOSCOPY WITH PROPOFOL (N/A) ENDOSCOPIC MUCOSAL RESECTION (N/A) as a surgical intervention.  The patient's history has been reviewed, patient examined, no change in status, stable for surgery.  I have reviewed the patient's chart and labs.  Questions were answered to the patient's satisfaction.     [name]

## 2020-12-17 NOTE — H&P (Signed)
[facility] History and Physical   Primary Care Physician:  Maurice Small, MD   Reason for Procedure:   Large colon polyp  Plan:    Colonoscopy with EMR     HPI: Patricia Chambers is a 70 y.o. female with large colon polyp is here for colonoscopy and removal of polyp by endoscopic mucosal resection (EMR)  The risks and benefits as well as alternatives of endoscopic procedure(s) have been discussed and reviewed. All questions answered. The patient agrees to proceed.    Past Medical History:  Diagnosis Date   Allergy    Arthritis    Asthma    Cataract    Colon polyp    patient states she had 1 removed 20 years ago   Complication of anesthesia    hard to wake up   FHx: migraine headaches    Hx of colonic polyps    Hyperlipidemia    Hypertension    PONV (postoperative nausea and vomiting)    Sarcoidosis    Thyroid disease     Past Surgical History:  Procedure Laterality Date   LEFT HEART CATHETERIZATION WITH CORONARY ANGIOGRAM N/A 06/24/2013   Procedure: LEFT HEART CATHETERIZATION WITH CORONARY ANGIOGRAM;  Surgeon: Blane Ohara, MD;  Location: [facility] CATH LAB;  Service: Cardiovascular;  Laterality: N/A;   TEMPOROMANDIBULAR JOINT SURGERY      Prior to Admission medications   Medication Sig Start Date End Date Taking? Authorizing Provider  acetaminophen (TYLENOL) 500 MG tablet Take 500-1,000 mg by mouth at bedtime.   Yes [provider]  fluticasone (FLONASE) 50 MCG/ACT nasal spray Place 2 sprays into both nostrils daily as needed for allergies or rhinitis.   Yes [provider]  hydroxypropyl methylcellulose / hypromellose (ISOPTO TEARS / GONIOVISC) 2.5 % ophthalmic solution Place 2 drops into both eyes in the morning and at bedtime.   Yes [provider]  levothyroxine (SYNTHROID) 25 MCG tablet Take 25 mcg by mouth daily before breakfast.   Yes [provider]  ondansetron (ZOFRAN ODT) 4 MG disintegrating tablet Take 1 tablet (4  mg total) by mouth every 8 (eight) hours as needed for nausea or vomiting. Take 1 tablet before prep and then 1 tablet every 8 hours as needed. 12/10/20   Mauri Pole, MD  PEG-KCl-NaCl-NaSulf-Na Asc-C (PLENVU) 140 g SOLR Take as instructed by physician 11/11/20   Mauri Pole, MD    No current facility-administered medications for this encounter.    Allergies as of 11/11/2020 - Review Complete 10/22/2020  Allergen Reaction Noted   Naproxen  06/20/2013   Penicillins  06/08/2010    Family History  Problem Relation Age of Onset   Diverticulitis Mother    Heart disease Mother    Diverticulitis Brother    Colon cancer Neg Hx    Pancreatic cancer Neg Hx    Esophageal cancer Neg Hx     Social History   Socioeconomic History   Marital status: Widowed    Spouse name: Not on file   Number of children: Not on file   Years of education: Not on file   Highest education level: Not on file  Occupational History   Not on file  Tobacco Use   Smoking status: Former    Packs/day: 1.00    Years: 5.00    Pack years: 5.00    Types: Cigarettes    Quit date: 03/28/1974    Years since quitting: 46.7   Smokeless tobacco: Never  Vaping Use  Vaping Use: Former  Substance and Sexual Activity   Alcohol use: Yes    Alcohol/week: 5.0 standard drinks    Types: 5 Glasses of wine per week    Comment: Or more   Drug use: No   Sexual activity: Not on file  Other Topics Concern   Not on file  Social History Narrative   Not on file   Social Determinants of Health   Financial Resource Strain: Not on file  Food Insecurity: Not on file  Transportation Needs: Not on file  Physical Activity: Not on file  Stress: Not on file  Social Connections: Not on file  Intimate Partner Violence: Not on file    Review of Systems:  All other review of systems negative except as mentioned in the HPI.  Physical Exam: Vital signs in last 24 hours:     General:   Alert, NAD Lungs:  Clear .    Heart:  Regular rate and rhythm Abdomen:  Soft, nontender and nondistended. Neuro/Psych:  Alert and cooperative. Normal mood and affect. A and O x 3   K. Denzil Magnuson , MD [phone]

## 2020-12-17 NOTE — Discharge Instructions (Signed)

## 2020-12-17 NOTE — Op Note (Addendum)
[facility] Patient Name: Patricia Chambers Procedure Date: 12/17/2020 MRN: [id number] Attending MD: Mauri Pole , MD Date of Birth: May 05, 1950 CSN: [id number] Age: 70 Admit Type: Outpatient Procedure:                Colonoscopy Indications:              Excision of colonic polyp Providers:                Mauri Pole, MD, Dulcy Fanny, Laverda Sorenson, Technician, Dellie Catholic Referring MD:              Medicines:                Monitored Anesthesia Care Complications:            No immediate complications. Estimated Blood Loss:     Estimated blood loss was minimal. Procedure:                Pre-Anesthesia Assessment:                           - Prior to the procedure, a History and Physical                            was performed, and patient medications and                            allergies were reviewed. The patient's tolerance of                            previous anesthesia was also reviewed. The risks                            and benefits of the procedure and the sedation                            options and risks were discussed with the patient.                            All questions were answered, and informed consent                            was obtained. Prior Anticoagulants: The patient has                            taken no previous anticoagulant or antiplatelet                            agents. ASA Grade Assessment: II - A patient with                            mild systemic disease. After reviewing the risks  and benefits, the patient was deemed in                            satisfactory condition to undergo the procedure.                           After obtaining informed consent, the colonoscope                            was passed under direct vision. Throughout the                            procedure, the patient's blood pressure, pulse, and                             oxygen saturations were monitored continuously. The                            PCF-HQ190L ([id number]) Olympus colonoscope was                            introduced through the anus and advanced to the the                            cecum, identified by appendiceal orifice and                            ileocecal valve. The colonoscopy was technically                            difficult and complex due to the patient's                            agitation, excessive drooling and airway                            obstruction. Successful completion of the procedure                            was aided by intubation and ventilation. The                            patient tolerated the procedure well. The quality                            of the bowel preparation was excellent. The                            ileocecal valve, appendiceal orifice, and rectum                            were photographed. Scope In: 10:38:46 AM Scope Out: 11:34:05 AM Scope Withdrawal Time: 0 hours 50 minutes 0 seconds  Total Procedure Duration: 0 hours 55 minutes 19 seconds  Findings:  The perianal and digital rectal examinations were normal.      Two sessile polyps were found in the ascending colon. The polyps were 5       to 9 mm in size. These polyps were removed with a cold snare. Resection       and retrieval were complete.      A 20 mm polyp was found in the hepatic flexure. The polyp was granular       lateral spreading. Preparations were made for mucosal resection. Orise       gel using spray chromoscopy technique was done to mark the borders of       the lesion. Orise gel was injected to raise and delineate the lesion.       Piecemeal mucosal resection using a hot snare was performed. Resection       and retrieval were complete.      Scattered small and large-mouthed diverticula were found in the sigmoid       colon, descending colon, transverse colon, ascending colon and cecum.       Purulent  discharge was seen in association with the diverticular opening       in sigmoid colon, suspicious of diverticulitis.      Non-bleeding external and internal hemorrhoids were found during       retroflexion. The hemorrhoids were small. Impression:               - Two 5 to 9 mm polyps in the ascending colon,                            removed with a cold snare. Resected and retrieved.                           - One 20 mm polyp at the hepatic flexure, removed                            with mucosal resection. Resected and retrieved.                           - Severe diverticulosis in the sigmoid colon, in                            the descending colon, in the transverse colon, in                            the ascending colon and in the cecum. Purulent                            discharge was seen in association with the                            diverticular opening, suspicious of diverticulitis.                           - Non-bleeding external and internal hemorrhoids.                           - Mucosal resection was performed. Resection and  retrieval were complete. Moderate Sedation:      Not Applicable - Patient had care per Anesthesia. Recommendation:           - Patient has a contact number available for                            emergencies. The signs and symptoms of potential                            delayed complications were discussed with the                            patient. Return to normal activities tomorrow.                            Written discharge instructions were provided to the                            patient.                           - Resume previous diet.                           - Continue present medications.                           - Ciprofloxacin 500mg  twice daily and Flagyl 500mg                             three times daily X 7 days                           - Await pathology results.                           -  Repeat colonoscopy in 1 year for surveillance                            after piecemeal polypectomy. Procedure Code(s):        --- Professional ---                           [phone], Colonoscopy, flexible; with endoscopic                            mucosal resection                           [id number], 38, Colonoscopy, flexible; with removal of                            tumor(s), polyp(s), or other lesion(s) by snare                            technique Diagnosis Code(s):        --- Professional ---  K63.5, Polyp of colon                           K64.8, Other hemorrhoids                           K57.30, Diverticulosis of large intestine without                            perforation or abscess without bleeding CPT copyright 2019 American Medical Association. All rights reserved. The codes documented in this report are preliminary and upon coder review may  be revised to meet current compliance requirements. Mauri Pole, MD 12/17/2020 11:55:16 AM This report has been signed electronically. Number of Addenda: 0

## 2020-12-17 NOTE — Anesthesia Procedure Notes (Signed)
Procedure Name: MAC Date/Time: 12/17/2020 10:35 AM Performed by: Claudia Desanctis, CRNA Pre-anesthesia Checklist: Patient identified, Emergency Drugs available, Suction available and Patient being monitored Patient Re-evaluated:Patient Re-evaluated prior to induction Oxygen Delivery Method: Simple face mask

## 2020-12-17 NOTE — Anesthesia Procedure Notes (Signed)
Procedure Name: LMA Insertion Date/Time: 12/17/2020 11:00 AM Performed by: Claudia Desanctis, CRNA Pre-anesthesia Checklist: Emergency Drugs available, Patient identified, Suction available and Patient being monitored Patient Re-evaluated:Patient Re-evaluated prior to induction Oxygen Delivery Method: Circle system utilized Preoxygenation: Pre-oxygenation with 100% oxygen Induction Type: IV induction Ventilation: Mask ventilation without difficulty LMA: LMA with gastric port inserted LMA Size: 4.0 Number of attempts: 1 Placement Confirmation: positive ETCO2 and breath sounds checked- equal and bilateral Tube secured with: Tape Dental Injury: Teeth and Oropharynx as per pre-operative assessment

## 2020-12-17 NOTE — Transfer of Care (Signed)
Immediate Anesthesia Transfer of Care Note  Patient: Patricia Chambers  Procedure(s) Performed: COLONOSCOPY WITH PROPOFOL ENDOSCOPIC MUCOSAL RESECTION SUBMUCOSAL LIFTING INJECTION POLYPECTOMY  Patient Location: Endoscopy Unit  Anesthesia Type:General  Level of Consciousness: awake, alert , oriented and patient cooperative  Airway & Oxygen Therapy: Patient Spontanous Breathing and Patient connected to face mask  Post-op Assessment: Report given to RN and Post -op Vital signs reviewed and stable  Post vital signs: Reviewed and stable  Last Vitals:  Vitals Value Taken Time  BP 123/73 12/17/20 1143  Temp    Pulse 86 12/17/20 1146  Resp 18 12/17/20 1146  SpO2 96 % 12/17/20 1146  Vitals shown include unvalidated device data.  Last Pain:  Vitals:   12/17/20 1143  TempSrc:   PainSc: 0-No pain      Patients Stated Pain Goal: 0 (18/86/77 3736)  Complications: No notable events documented.

## 2020-12-17 NOTE — Anesthesia Postprocedure Evaluation (Signed)
Anesthesia Post Note  Patient: Occupational hygienist  Procedure(s) Performed: COLONOSCOPY WITH PROPOFOL ENDOSCOPIC MUCOSAL RESECTION SUBMUCOSAL LIFTING INJECTION POLYPECTOMY     Patient location during evaluation: PACU Anesthesia Type: MAC and General Level of consciousness: awake and alert, oriented and patient cooperative Pain management: pain level controlled Vital Signs Assessment: post-procedure vital signs reviewed and stable Respiratory status: spontaneous breathing, nonlabored ventilation and respiratory function stable Cardiovascular status: blood pressure returned to baseline and stable Postop Assessment: no apparent nausea or vomiting Anesthetic complications: no Comments: Converted to general anesthesia d/t severe obstruction under MAC   No notable events documented.  Last Vitals:  Vitals:   12/17/20 1143 12/17/20 1153  BP: 123/73 (!) 144/77  Pulse: 88 77  Resp: 20 16  Temp: 36.4 C   SpO2: 96% 93%    Last Pain:  Vitals:   12/17/20 1153  TempSrc:   PainSc: 0-No pain                 Pervis Hocking

## 2020-12-17 NOTE — Progress Notes (Signed)
Recovery care handed off to Carmie End, Therapist, sports. Report given.  Debarah Crape, BSN, RN

## 2020-12-18 ENCOUNTER — Encounter (HOSPITAL_COMMUNITY): Payer: Self-pay | Admitting: Gastroenterology

## 2020-12-18 ENCOUNTER — Encounter: Payer: Self-pay | Admitting: Gastroenterology

## 2020-12-18 LAB — SURGICAL PATHOLOGY

## 2021-01-25 ENCOUNTER — Other Ambulatory Visit: Payer: Self-pay

## 2021-01-25 ENCOUNTER — Encounter: Payer: Self-pay | Admitting: Internal Medicine

## 2021-01-25 ENCOUNTER — Ambulatory Visit: Payer: Medicare Other | Admitting: Internal Medicine

## 2021-01-25 VITALS — BP 128/86 | HR 88 | Ht 67.0 in | Wt 189.4 lb

## 2021-01-25 DIAGNOSIS — R0609 Other forms of dyspnea: Secondary | ICD-10-CM

## 2021-01-25 DIAGNOSIS — I10 Essential (primary) hypertension: Secondary | ICD-10-CM

## 2021-01-25 DIAGNOSIS — E785 Hyperlipidemia, unspecified: Secondary | ICD-10-CM

## 2021-01-25 DIAGNOSIS — R0789 Other chest pain: Secondary | ICD-10-CM

## 2021-01-25 MED ORDER — ROSUVASTATIN CALCIUM 10 MG PO TABS: 10.0000 mg | ORAL_TABLET | Freq: Every day | ORAL | 11 refills | Status: DC

## 2021-01-25 NOTE — Progress Notes (Signed)
Cardiology Office Note   Date:  01/25/2021   ID:  Patricia Chambers, DOB [birth year], MRN [id number]  PCP:  Maurice Small, MD (Inactive)  Cardiologist:   Dorris Carnes, MD   Pt presents for f/u of CP   has questions       History of Present Illness: Patricia Chambers is a 70 y.o. female with a history of CP   I saw her in 80   Eval was a stress test   This lead to a cardiac cath whic is normal    Carotid USN  with minimal plalquing    The pt was last in cardiology clinic in 2015   She had concerns and wanted reeval  Currently denies CP   Breathing is [facility] says she does not an irregular HB  Usually at night    Seen in ED x 2   Nothing  found   No dizzienss Says her BP is high in clnic but better at home      Pt does not some fatigue     Current Meds  Medication Sig   acetaminophen (TYLENOL) 500 MG tablet Take 500-1,000 mg by mouth at bedtime.   fluticasone (FLONASE) 50 MCG/ACT nasal spray Place 2 sprays into both nostrils daily as needed for allergies or rhinitis.   hydroxypropyl methylcellulose / hypromellose (ISOPTO TEARS / GONIOVISC) 2.5 % ophthalmic solution Place 2 drops into both eyes in the morning and at bedtime.   levothyroxine (SYNTHROID) 25 MCG tablet Take 25 mcg by mouth daily before breakfast.     Allergies:   Naproxen and Penicillins   Past Medical History:  Diagnosis Date   Allergy    Arthritis    Asthma    Cataract    Colon polyp    patient states she had 1 removed 20 years ago   FHx: migraine headaches    Hx of colonic polyps    Hyperlipidemia    Hypertension    Sarcoidosis    Thyroid disease     Past Surgical History:  Procedure Laterality Date   COLONOSCOPY WITH PROPOFOL N/A 12/17/2020   Procedure: COLONOSCOPY WITH PROPOFOL;  Surgeon: Mauri Pole, MD;  Location: [facility] ENDOSCOPY;  Service: Endoscopy;  Laterality: N/A;   ENDOSCOPIC MUCOSAL RESECTION N/A 12/17/2020   Procedure: ENDOSCOPIC MUCOSAL RESECTION;  Surgeon: Mauri Pole, MD;   Location: [facility]OS[facility];  Service: Endoscopy;  Laterality: N/A;   LEFT HEART CATHETERIZATION WITH CORONARY ANGIOGRAM N/A 06/24/2013   Procedure: LEFT HEART CATHETERIZATION WITH CORONARY ANGIOGRAM;  Surgeon: Blane Ohara, MD;  Location: [facility] CATH LAB;  Service: Cardiovascular;  Laterality: N/A;   POLYPECTOMY  12/17/2020   Procedure: POLYPECTOMY;  Surgeon: Mauri Pole, MD;  Location: [facility];  Service: Endoscopy;;   SUBMUCOSAL LIFTING INJECTION  12/17/2020   Procedure: SUBMUCOSAL LIFTING INJECTION;  Surgeon: Mauri Pole, MD;  Location: [facility];  Service: Endoscopy;;   TEMPOROMANDIBULAR JOINT SURGERY       Social History:  The patient  reports that she quit smoking about 46 years ago. Her smoking use included cigarettes. She has a 5.00 pack-year smoking history. She has never used smokeless tobacco. She reports that she does not currently use alcohol after a past usage of about 5.0 standard drinks per week. She reports that she does not use drugs.   Family History:  The patient's family history includes Diverticulitis in her brother and mother; Heart disease in her mother.    ROS:  Please see the history  of present illness. All other systems are reviewed and  Negative to the above problem except as noted.    PHYSICAL EXAM: VS:  BP 128/86   Pulse 88   Ht 5\' 7"  (1.702 m)   Wt 189 lb 6.4 oz (85.9 kg)   SpO2 98%   BMI 29.66 kg/m   GEN: Overweigh 70 yo  , in no acute distress  HEENT: normal  Neck: no JVD, carotid bruits Cardiac: RRR; no murmurrs  no edema  Respiratory:  clear to auscultation bilaterally,  GI: soft, nontender, nondistended, + BS  No hepatomegaly  MS: no deformity Moving all extremities   Skin: warm and dry, no rash Neuro:  Strength and sensation are intact Psych: euthymic mood, full affect   EKG:  EKG is not ordered today.   Lipid Panel    Component Value Date/Time   CHOL 208 (H) 06/21/2013 0437   TRIG 209 (H) 06/21/2013 0437   HDL 47  06/21/2013 0437   CHOLHDL 4.4 06/21/2013 0437   VLDL 42 (H) 06/21/2013 0437   LDLCALC 119 (H) 06/21/2013 0437      Wt Readings from Last 3 Encounters:  01/25/21 189 lb 6.4 oz (85.9 kg)  12/17/20 188 lb (85.3 kg)  10/22/20 192 lb (87.1 kg)      ASSESSMENT AND PLAN:  1  Atherosclerosis   Pt remains asymptomatic   Risk factor modify  2  Palpitations   Sound like isolated ectopy  3  Fatigue    I am not convinced cardiac   has orthopedic issures  4  HL   Will start Crestor 10 mg   Follow up lipdis and AST in 8 wks       F/U July      Current medicines are reviewed at length with the patient today.  The patient does not have concerns regarding medicines.  Signed, Dorris Carnes, MD  01/25/2021 10:41 AM    [address], [place], [place]  [address] Phone: [phone]; Fax: [phone]

## 2021-01-25 NOTE — Patient Instructions (Signed)
Medication Instructions:   START Crestor one tablet by mouth ( 10 mg) daily.   *If you need a refill on your cardiac medications before your next appointment, please call your pharmacy*   Lab Work: Your physician recommends that you return for a FASTING lipid profile/AST/CK on Wednesday, December 28. You can come in the day of appointment between 7:30-4:30 fasting from midnight the night before.   If you have labs (blood work) drawn today and your tests are completely normal, you will receive your results only by: [place] (if you have MyChart) OR A paper copy in the mail If you have any lab test that is abnormal or we need to change your treatment, we will call you to review the results.   Testing/Procedures:  -NONE   Follow-Up: At [facility], you and your health needs are our priority.  As part of our continuing mission to provide you with exceptional heart care, we have created designated Provider Care Teams.  These Care Teams include your primary Cardiologist (physician) and Advanced Practice Providers (APPs -  Physician Assistants and Nurse Practitioners) who all work together to provide you with the care you need, when you need it.  We recommend signing up for the patient portal called "MyChart".  Sign up information is provided on this After Visit Summary.  MyChart is used to connect with patients for Virtual Visits (Telemedicine).  Patients are able to view lab/test results, encounter notes, upcoming appointments, etc.  Non-urgent messages can be sent to your provider as well.   To learn more about what you can do with MyChart, go to [email or web address].    Your next appointment:   9 month(s)  The format for your next appointment:   In Person  Provider:   Dorris Carnes, MD   Other Instructions  Your physician wants you to follow-up in: 9 months with Dr.Ross.  You will receive a reminder letter in the mail two months in advance. If you don't receive a letter,  please call our office to schedule the follow-up appointment.

## 2021-02-15 ENCOUNTER — Ambulatory Visit
Admission: RE | Admit: 2021-02-15 | Discharge: 2021-02-15 | Disposition: A | Discharge status: Home / Self Care | Payer: Medicare Other | Source: Ambulatory Visit | Attending: Family Medicine | Admitting: Family Medicine

## 2021-02-15 ENCOUNTER — Other Ambulatory Visit: Payer: Self-pay

## 2021-02-15 DIAGNOSIS — Z1231 Encounter for screening mammogram for malignant neoplasm of breast: Secondary | ICD-10-CM

## 2021-03-24 ENCOUNTER — Other Ambulatory Visit: Payer: Self-pay

## 2021-03-24 ENCOUNTER — Other Ambulatory Visit: Payer: Medicare Other | Admitting: *Deleted

## 2021-03-24 DIAGNOSIS — E785 Hyperlipidemia, unspecified: Secondary | ICD-10-CM

## 2021-03-24 DIAGNOSIS — I10 Essential (primary) hypertension: Secondary | ICD-10-CM

## 2021-03-24 DIAGNOSIS — R0789 Other chest pain: Secondary | ICD-10-CM

## 2021-03-24 DIAGNOSIS — R0609 Other forms of dyspnea: Secondary | ICD-10-CM

## 2021-03-24 LAB — CK: Total CK: 190 U/L — ABNORMAL HIGH (ref 32–182)

## 2021-03-24 LAB — LIPID PANEL
Chol/HDL Ratio: 2.8 ratio (ref 0.0–4.4)
Cholesterol, Total: 138 mg/dL (ref 100–199)
HDL: 50 mg/dL (ref >39)
LDL Chol Calc (NIH): 63 mg/dL (ref 0–99)
Triglycerides: 143 mg/dL (ref 0–149)
VLDL Cholesterol Cal: 25 mg/dL (ref 5–40)

## 2021-03-24 LAB — AST: AST: 19 IU/L (ref 0–40)

## 2021-03-26 ENCOUNTER — Telehealth: Payer: Self-pay

## 2021-03-26 DIAGNOSIS — R748 Abnormal levels of other serum enzymes: Secondary | ICD-10-CM

## 2021-03-26 NOTE — Telephone Encounter (Signed)
-----   Message from Dorris Carnes V, MD sent at 03/26/2021  7:59 AM EST ----- Lipids are excellent Liver function Test is normal CK is trivially elevated 180     I would repeat in 3 wks  May represent effecto of normal muscle strain

## 2021-03-26 NOTE — Telephone Encounter (Signed)
Called pt to review results of lipids and LFT done on 12/28. Per Dr Harrington Challenger' recommendation, pt agreed to return to office in 3 weeks for repeat CK level to monitor elevation. Visit scheduled for 04/26/21 at this time. Judson Roch, RN

## 2021-04-16 ENCOUNTER — Other Ambulatory Visit: Payer: Medicare Other | Admitting: *Deleted

## 2021-04-16 ENCOUNTER — Other Ambulatory Visit: Payer: Self-pay

## 2021-04-16 DIAGNOSIS — R748 Abnormal levels of other serum enzymes: Secondary | ICD-10-CM

## 2021-04-16 LAB — CK: Total CK: 154 U/L (ref 32–182)

## 2021-04-26 ENCOUNTER — Ambulatory Visit: Payer: Medicare Other | Admitting: Internal Medicine

## 2021-04-26 ENCOUNTER — Other Ambulatory Visit: Payer: Self-pay

## 2021-04-26 ENCOUNTER — Encounter: Payer: Self-pay | Admitting: Internal Medicine

## 2021-04-26 VITALS — BP 118/78 | HR 88 | Resp 18 | Ht 67.0 in | Wt 192.6 lb

## 2021-04-26 DIAGNOSIS — I700 Atherosclerosis of aorta: Secondary | ICD-10-CM | POA: Diagnosis not present

## 2021-04-26 DIAGNOSIS — E039 Hypothyroidism, unspecified: Secondary | ICD-10-CM

## 2021-04-26 DIAGNOSIS — R413 Other amnesia: Secondary | ICD-10-CM

## 2021-04-26 DIAGNOSIS — R350 Frequency of micturition: Secondary | ICD-10-CM

## 2021-04-26 DIAGNOSIS — E785 Hyperlipidemia, unspecified: Secondary | ICD-10-CM

## 2021-04-26 DIAGNOSIS — K635 Polyp of colon: Secondary | ICD-10-CM | POA: Diagnosis not present

## 2021-04-26 DIAGNOSIS — I10 Essential (primary) hypertension: Secondary | ICD-10-CM

## 2021-04-26 LAB — URINALYSIS, ROUTINE W REFLEX MICROSCOPIC
Bilirubin Urine: NEGATIVE
Hgb urine dipstick: NEGATIVE
Ketones, ur: NEGATIVE
Nitrite: NEGATIVE
RBC / HPF: NONE SEEN (ref 0–?)
Specific Gravity, Urine: 1.02 (ref 1.000–1.030)
Total Protein, Urine: NEGATIVE
Urine Glucose: NEGATIVE
Urobilinogen, UA: 1 (ref 0.0–1.0)
pH: 6.5 (ref 5.0–8.0)

## 2021-04-26 LAB — CBC
HCT: 44.2 % (ref 36.0–46.0)
Hemoglobin: 14.3 g/dL (ref 12.0–15.0)
MCHC: 32.4 g/dL (ref 30.0–36.0)
MCV: 88 fl (ref 78.0–100.0)
Platelets: 197 10*3/uL (ref 150.0–400.0)
RBC: 5.02 Mil/uL (ref 3.87–5.11)
RDW: 13.5 % (ref 11.5–15.5)
WBC: 5.6 10*3/uL (ref 4.0–10.5)

## 2021-04-26 LAB — COMPREHENSIVE METABOLIC PANEL
ALT: 22 U/L (ref 0–35)
AST: 21 U/L (ref 0–37)
Albumin: 4.7 g/dL (ref 3.5–5.2)
Alkaline Phosphatase: 52 U/L (ref 39–117)
BUN: 13 mg/dL (ref 6–23)
CO2: 26 mEq/L (ref 19–32)
Calcium: 9.5 mg/dL (ref 8.4–10.5)
Chloride: 104 mEq/L (ref 96–112)
Creatinine, Ser: 1.04 mg/dL (ref 0.40–1.20)
GFR: 54.21 mL/min — ABNORMAL LOW (ref >60.00)
Glucose, Bld: 106 mg/dL — ABNORMAL HIGH (ref 70–99)
Potassium: 4.1 mEq/L (ref 3.5–5.1)
Sodium: 141 mEq/L (ref 135–145)
Total Bilirubin: 0.9 mg/dL (ref 0.2–1.2)
Total Protein: 7 g/dL (ref 6.0–8.3)

## 2021-04-26 LAB — VITAMIN D 25 HYDROXY (VIT D DEFICIENCY, FRACTURES): VITD: 34.02 ng/mL (ref 30.00–100.00)

## 2021-04-26 LAB — TSH: TSH: 2.82 u[IU]/mL (ref 0.35–5.50)

## 2021-04-26 LAB — VITAMIN B12: Vitamin B-12: 348 pg/mL (ref 211–911)

## 2021-04-26 LAB — T4, FREE: Free T4: 0.91 ng/dL (ref 0.60–1.60)

## 2021-04-26 NOTE — Patient Instructions (Signed)
We will check the labs and the MRI and the urine today.

## 2021-04-26 NOTE — Progress Notes (Signed)
° °  Subjective:   Patient ID: Patricia Chambers, female    DOB: 05/20/1950, 71 y.o.   MRN: [id number]  HPI The patient is a new 71 YO female coming in for multiple concerns. No PCP in some time due to past provider leaving.  PMH, [facility], social history reviewed and updated  Review of Systems  Constitutional:  Positive for activity change and appetite change.  HENT: Negative.    Eyes: Negative.   Respiratory:  Negative for cough, chest tightness and shortness of breath.   Cardiovascular:  Negative for chest pain, palpitations and leg swelling.  Gastrointestinal:  Negative for abdominal distention, abdominal pain, constipation, diarrhea, nausea and vomiting.  Musculoskeletal:  Positive for arthralgias, gait problem and myalgias.  Skin: Negative.   Neurological:  Positive for numbness. Negative for dizziness, syncope, weakness and headaches.       Memory changes  Psychiatric/Behavioral:  Positive for sleep disturbance.    Objective:  Physical Exam Constitutional:      Appearance: She is well-developed.  HENT:     Head: Normocephalic and atraumatic.  Cardiovascular:     Rate and Rhythm: Normal rate and regular rhythm.  Pulmonary:     Effort: Pulmonary effort is normal. No respiratory distress.     Breath sounds: Normal breath sounds. No wheezing or rales.  Abdominal:     General: Bowel sounds are normal. There is no distension.     Palpations: Abdomen is soft.     Tenderness: There is no abdominal tenderness. There is no rebound.  Musculoskeletal:        General: Tenderness present.     Cervical back: Normal range of motion.  Skin:    General: Skin is warm and dry.  Neurological:     Mental Status: She is alert and oriented to person, place, and time.     Coordination: Coordination normal.    Vitals:   04/26/21 0824  BP: 118/78  Pulse: 88  Resp: 18  SpO2: 98%  Weight: 192 lb 9.6 oz (87.4 kg)  Height: 5\' 7"  (1.702 m)    This visit occurred during the SARS-CoV-2 public health  emergency.  Safety protocols were in place, including screening questions prior to the visit, additional usage of staff PPE, and extensive cleaning of exam room while observing appropriate contact time as indicated for disinfecting solutions.   Assessment & Plan:

## 2021-04-27 ENCOUNTER — Other Ambulatory Visit: Payer: Self-pay | Admitting: Internal Medicine

## 2021-04-27 DIAGNOSIS — E039 Hypothyroidism, unspecified: Secondary | ICD-10-CM | POA: Insufficient documentation

## 2021-04-27 DIAGNOSIS — R413 Other amnesia: Secondary | ICD-10-CM | POA: Insufficient documentation

## 2021-04-27 DIAGNOSIS — R350 Frequency of micturition: Secondary | ICD-10-CM | POA: Insufficient documentation

## 2021-04-27 DIAGNOSIS — I700 Atherosclerosis of aorta: Secondary | ICD-10-CM | POA: Insufficient documentation

## 2021-04-27 DIAGNOSIS — E2839 Other primary ovarian failure: Secondary | ICD-10-CM

## 2021-04-27 NOTE — Assessment & Plan Note (Signed)
Has predominantly nocturia. Asked to reduce liquids after 7pm. She limits caffeine to before noon already. Checking U/A to rule out subacute infection. There is some pelvic pressure.

## 2021-04-27 NOTE — Assessment & Plan Note (Signed)
BP at goal off meds. Will need close monitoring.

## 2021-04-27 NOTE — Assessment & Plan Note (Signed)
Due for recall in 1 year and counseled about this. Family history of polyps.

## 2021-04-27 NOTE — Assessment & Plan Note (Signed)
Noted on scans that found her torn hamstring. She recently started crestor 10 mg daily and doing well. Encouraged to continue.

## 2021-04-27 NOTE — Assessment & Plan Note (Signed)
Checking TSH and free T4, B12, vitamin D, CBC, CMP, MRI brain. She has noticed this in the last months to year or so. Seems like her short term memory recall is poor. She denies anxiety/depression contributing.

## 2021-04-27 NOTE — Assessment & Plan Note (Signed)
Taking synthroid 25 mcg daily. Checking TSH and free T4 and adjust as needed.

## 2021-04-27 NOTE — Assessment & Plan Note (Signed)
Taking crestor 10 mg daily and will continue.

## 2021-04-30 ENCOUNTER — Ambulatory Visit
Admission: RE | Admit: 2021-04-30 | Discharge: 2021-04-30 | Disposition: A | Discharge status: Home / Self Care | Payer: Medicare Other | Source: Ambulatory Visit | Attending: Family Medicine | Admitting: Family Medicine

## 2021-04-30 DIAGNOSIS — E2839 Other primary ovarian failure: Secondary | ICD-10-CM

## 2021-05-15 ENCOUNTER — Ambulatory Visit
Admission: RE | Admit: 2021-05-15 | Discharge: 2021-05-15 | Disposition: A | Discharge status: Home / Self Care | Payer: Medicare Other | Source: Ambulatory Visit | Attending: Internal Medicine | Admitting: Internal Medicine

## 2021-05-15 ENCOUNTER — Other Ambulatory Visit: Payer: Self-pay

## 2021-05-15 DIAGNOSIS — R413 Other amnesia: Secondary | ICD-10-CM

## 2021-06-15 ENCOUNTER — Telehealth: Payer: Self-pay

## 2021-06-15 DIAGNOSIS — R0609 Other forms of dyspnea: Secondary | ICD-10-CM

## 2021-06-15 NOTE — Telephone Encounter (Signed)
Pt is requesting a refill: ?levothyroxine (SYNTHROID) 25 MCG tablet ? ?[place] ?

## 2021-06-16 MED ORDER — LEVOTHYROXINE SODIUM 25 MCG PO TABS: 25.0000 ug | ORAL_TABLET | Freq: Every day | ORAL | 3 refills | Status: DC

## 2021-06-16 NOTE — Telephone Encounter (Signed)
Ok to refill #90 3 refills ?

## 2021-06-16 NOTE — Telephone Encounter (Signed)
Refill has been sent to the pt's pharmacy  

## 2021-07-27 ENCOUNTER — Ambulatory Visit: Payer: Medicare Other | Admitting: Internal Medicine

## 2021-08-16 ENCOUNTER — Ambulatory Visit: Payer: Medicare Other | Admitting: Internal Medicine

## 2021-08-16 ENCOUNTER — Encounter: Payer: Self-pay | Admitting: Internal Medicine

## 2021-08-16 VITALS — BP 126/70 | HR 70 | Resp 18 | Ht 67.0 in | Wt 184.2 lb

## 2021-08-16 DIAGNOSIS — E039 Hypothyroidism, unspecified: Secondary | ICD-10-CM

## 2021-08-16 DIAGNOSIS — M79605 Pain in left leg: Secondary | ICD-10-CM | POA: Diagnosis not present

## 2021-08-16 DIAGNOSIS — R413 Other amnesia: Secondary | ICD-10-CM

## 2021-08-16 DIAGNOSIS — H9193 Unspecified hearing loss, bilateral: Secondary | ICD-10-CM

## 2021-08-16 NOTE — Progress Notes (Signed)
   Subjective:   Patient ID: Patricia Chambers, female    DOB: Apr 27, 1950, 71 y.o.   MRN: [id number]  HPI The patient is a 71 YO female coming in for follow up.  Review of Systems  Constitutional: Negative.   HENT:  Positive for hearing loss.   Eyes: Negative.   Respiratory:  Negative for cough, chest tightness and shortness of breath.   Cardiovascular:  Negative for chest pain, palpitations and leg swelling.  Gastrointestinal:  Negative for abdominal distention, abdominal pain, constipation, diarrhea, nausea and vomiting.  Musculoskeletal:  Positive for arthralgias and myalgias.  Skin: Negative.   Neurological: Negative.   Psychiatric/Behavioral: Negative.     Objective:  Physical Exam Constitutional:      Appearance: She is well-developed.  HENT:     Head: Normocephalic and atraumatic.  Cardiovascular:     Rate and Rhythm: Normal rate and regular rhythm.  Pulmonary:     Effort: Pulmonary effort is normal. No respiratory distress.     Breath sounds: Normal breath sounds. No wheezing or rales.  Abdominal:     General: Bowel sounds are normal. There is no distension.     Palpations: Abdomen is soft.     Tenderness: There is no abdominal tenderness. There is no rebound.  Musculoskeletal:        General: Tenderness present.     Cervical back: Normal range of motion.  Skin:    General: Skin is warm and dry.  Neurological:     Mental Status: She is alert and oriented to person, place, and time.     Coordination: Coordination normal.    Vitals:   08/16/21 1012  BP: 126/70  Pulse: 70  Resp: 18  SpO2: 99%  Weight: 184 lb 3.2 oz (83.6 kg)  Height: '5\' 7"'$  (1.702 m)    Assessment & Plan:  Visit time 25 minutes in face to face communication with patient and coordination of care, additional 10 minutes spent in record review, coordination or care, ordering tests, communicating/referring to other healthcare professionals, documenting in medical records all on the same day of the  visit for total time 35 minutes spent on the visit.

## 2021-08-16 NOTE — Patient Instructions (Addendum)
We will do the physical therapy for the leg. In the fall when we do your well check we can check the hepatitis c screening.  We will check the hearing.

## 2021-08-20 DIAGNOSIS — H9193 Unspecified hearing loss, bilateral: Secondary | ICD-10-CM | POA: Insufficient documentation

## 2021-08-20 DIAGNOSIS — M79605 Pain in left leg: Secondary | ICD-10-CM | POA: Insufficient documentation

## 2021-08-20 NOTE — Assessment & Plan Note (Signed)
Referral to audiology for hearing assessment and counseling about hearing aid options if needed.

## 2021-08-20 NOTE — Assessment & Plan Note (Signed)
Upper thigh radiating from low back and referral to PT. Pain is paraspinal on exam and would benefit from PT. Using no pain medicine otc currently. Was previously using tylenol but this was causing side effects.

## 2021-08-20 NOTE — Assessment & Plan Note (Signed)
Levels normal in January, is having fatigue ongoing but this was also present in January so recheck of levels is not indicated today.

## 2021-08-20 NOTE — Assessment & Plan Note (Signed)
She feels that this was related to being on some medications and we discussed that some medications can impact memory and sedation. Since she is not on those medications any longer this is typically not permanent. She does endorse short term memory changes. We did discuss fairly normal MRI scan which does not rule out alzheimer's. We will need to monitor ongoing for any progression of changes. No family member present today. No reported difficulties with cooking, driving.

## 2021-08-31 ENCOUNTER — Ambulatory Visit: Payer: Medicare Other | Attending: Audiologist | Admitting: Audiologist

## 2021-08-31 DIAGNOSIS — M6281 Muscle weakness (generalized): Secondary | ICD-10-CM | POA: Diagnosis present

## 2021-08-31 DIAGNOSIS — H903 Sensorineural hearing loss, bilateral: Secondary | ICD-10-CM | POA: Diagnosis present

## 2021-08-31 DIAGNOSIS — R2689 Other abnormalities of gait and mobility: Secondary | ICD-10-CM | POA: Diagnosis present

## 2021-08-31 DIAGNOSIS — M79605 Pain in left leg: Secondary | ICD-10-CM | POA: Diagnosis present

## 2021-08-31 NOTE — Procedures (Signed)
  Outpatient Audiology and [place], [place]  [address] [phone]  AUDIOLOGICAL  EVALUATION  NAME: Patricia Chambers     DOB:   [birth year]      MRN: [id number]                                                                                     DATE: 08/31/2021     REFERENT: Hoyt Koch, MD STATUS: Outpatient DIAGNOSIS: Sensorineural Hearing Loss Bilaterally     History: Patricia Chambers was seen for an audiological evaluation.  Patricia Chambers is receiving a hearing evaluation due to concerns for difficulty hearing on a daily basis. Patricia Chambers has difficulty hearing accented talkers, when people are at a distance, and on the phone. This difficulty began gradually. No constant pain or pressure reported in either ear. Tinnitus present in both ears after a show years ago but has since stopped. Patricia Chambers has had hearing tests before showing moderate loss. During the pandemic she was mostly home alone. Now that she is traveling and seeing people more often so she notices how the hearing loss is impacting her. Her family has encouraged her to address the hearing loss.  Medical history positive for memory loss which is a risk factor for hearing loss. No other relevant case history reported.   Evaluation:  Otoscopy showed a clear view of the tympanic membranes, bilaterally Tympanometry results were consistent with normal middle ear pressure, bilaterally   Audiometric testing was completed using conventional audiometry with insert and supraural transducer. Speech Recognition Thresholds were 45dB in each ear, consistent with pure tone averages. Word Recognition was 100% in each ear at 85dB with masking. Pure tone thresholds show mild sloping slightly to moderate sensorineural hearing loss in both ears. Test results are consistent with moderate symmetric hearing loss.   Results:  The test results were reviewed with Patricia Chambers. Patricia Chambers has a relatively flat symmetric moderate sensorineural  hearing loss bilaterally. She is aware she has a hearing aid benefit through [employer]. She was given a copy of her hearing test and one was mailed to her. She has the number to call [place] start the process of getting hearing aids.    Recommendations: Amplification is necessary for both ears. Hearing aids can be purchased from a variety of locations. See provided list for locations in the [place] area. See handout on how to obtain hearing aids through [employer] benefit.    31 minutes spent testing and counseling on results.   Alfonse Alpers  Audiologist, Au.D., CCC-A 08/31/2021  1:15 PM  Cc: Hoyt Koch, MD

## 2021-09-01 NOTE — Therapy (Signed)
OUTPATIENT PHYSICAL THERAPY EVALUATION   Patient Name: Patricia Chambers MRN: [id number] DOB:09-02-1950, 71 y.o., female Today's Date: 09/03/2021   PT End of Session - 09/03/21 1028     Visit Number 1    Number of Visits 8    Date for PT Re-Evaluation 10/29/21    Authorization Type [employer] MEDICARE    Authorization Time Period FOTO by 6th, KX by 15th    Progress Note Due on Visit 10    PT Start Time 1045    PT Stop Time 1130    PT Time Calculation (min) 45 min    Activity Tolerance Patient tolerated treatment well    Behavior During Therapy WFL for tasks assessed/performed             Past Medical History:  Diagnosis Date   Allergy    Arthritis    Asthma    Cataract    Colon polyp    patient states she had 1 removed 20 years ago   Essential hypertension 06/21/2013   FHx: migraine headaches    Hx of colonic polyps    Hyperlipidemia    Hypertension    Sarcoidosis    Thyroid disease    Past Surgical History:  Procedure Laterality Date   COLONOSCOPY WITH PROPOFOL N/A 12/17/2020   Procedure: COLONOSCOPY WITH PROPOFOL;  Surgeon: Mauri Pole, MD;  Location: [facility] ENDOSCOPY;  Service: Endoscopy;  Laterality: N/A;   ENDOSCOPIC MUCOSAL RESECTION N/A 12/17/2020   Procedure: ENDOSCOPIC MUCOSAL RESECTION;  Surgeon: Mauri Pole, MD;  Location: [facility];  Service: Endoscopy;  Laterality: N/A;   LEFT HEART CATHETERIZATION WITH CORONARY ANGIOGRAM N/A 06/24/2013   Procedure: LEFT HEART CATHETERIZATION WITH CORONARY ANGIOGRAM;  Surgeon: Blane Ohara, MD;  Location: [facility] CATH LAB;  Service: Cardiovascular;  Laterality: N/A;   POLYPECTOMY  12/17/2020   Procedure: POLYPECTOMY;  Surgeon: Mauri Pole, MD;  Location: [facility]OS[facility];  Service: Endoscopy;;   SUBMUCOSAL LIFTING INJECTION  12/17/2020   Procedure: SUBMUCOSAL LIFTING INJECTION;  Surgeon: Mauri Pole, MD;  Location: [facility]OS[facility];  Service: Endoscopy;;   TEMPOROMANDIBULAR JOINT SURGERY     Patient Active  Problem List   Diagnosis Date Noted   Left leg pain 08/20/2021   Bilateral hearing loss 08/20/2021   Memory loss 04/27/2021   Hypothyroidism 04/27/2021   Aortic atherosclerosis ([place]) 04/27/2021   Urinary frequency 04/27/2021   Adenomatous polyp of transverse colon    Polyp of ascending colon    Atypical chest pain 12/20/2018   DOE (dyspnea on exertion) 11/07/2018   Dyslipidemia 06/21/2013   Family history of coronary artery disease 06/21/2013    PCP: Hoyt Koch, MD  REFERRING PROVIDER: Hoyt Koch, MD  REFERRING DIAG: Left leg pain  THERAPY DIAG:  Muscle weakness (generalized)  Pain in left leg  Other abnormalities of gait and mobility  Rationale for Evaluation and Treatment Rehabilitation  ONSET DATE: 06/16/2021   SUBJECTIVE:  SUBJECTIVE STATEMENT: Patient reports a year ago she tore her hamstring and ruptured tendons in the left leg, which still bothers her. She also reports her legs are not the same length (right leg shorter) and she developed left hip bursitis and that bothers her. She reports she broke her right ankle previously which occasionally bothers her. Patient reports that she was having balance issues, this is better because she had been abroad and climbing a lot of stairs so she got stronger. She also has a sore spot in her lower back and this sometimes bothers  her since her hamstring injury. She notes numbness in her left big toe.   She has been walking 30-35 minutes 5x/week recently.  PERTINENT HISTORY: 05/2020 left hamstring tear, previous right ankle fracture 2 years ago, history of left hip bursitis, chronic low back pain  PAIN:  Are you having pain? Yes:  NPRS scale: 1/10 Pain location: Left leg (hamstring) Pain description: Burning Aggravating factors: Constant, walking, stairs, squat Relieving factors: Medication, rest   PRECAUTIONS: None  WEIGHT BEARING RESTRICTIONS No  FALLS:  Has patient fallen in last 6 months?  No  LIVING ENVIRONMENT: Lives with: lives with their family Lives in: House/apartment, 2 level All living is on 1st floor  OCCUPATION: Retired  PLOF: Independent  PATIENT GOALS: Pain relief and learn how to get stronger so she can remain functionally independent   OBJECTIVE:  PATIENT SURVEYS:  FOTO 57% functional status  COGNITION: Overall cognitive status: Within functional limits for tasks assessed     SENSATION: WFL  MUSCLE LENGTH: Left hamstring length deficit  PALPATION: Tender throughout left proximal-mid hamstring, left hip greater trochanteric region  LOWER EXTREMITY ROM:   Hip and knee ROM grossly WFL  LOWER EXTREMITY MMT:  MMT Right eval Left eval  Hip flexion 4 4-  Hip extension 4- 3-  Hip abduction 4- 3+  Knee flexion 5 4-  Knee extension 5 4  Ankle dorsiflexion 5 5  Ankle plantarflexion 4 4   FUNCTIONAL TESTS:  Squat: patient demonstrates decreased depth with weight shift toward right  GAIT: Assistive device utilized: None Level of assistance: Complete Independence Comments: Antalgic on left, coxalgic gait on left   TODAY'S TREATMENT: SLR x 10 Supine active hamstring stretch x 10 Bridge x 10 Side clamshell with red x 10 Prone hamstring curl x 10   PATIENT EDUCATION:  Education details: Exam findings, POC, HEP Person educated: Patient Education method: Explanation, Demonstration, Tactile cues, Verbal cues, and Handouts Education comprehension: verbalized understanding, returned demonstration, verbal cues required, tactile cues required, and needs further education  HOME EXERCISE PROGRAM: Access Code: [email or web address]   ASSESSMENT: CLINICAL IMPRESSION: Patient is a 71 y.o. female who was seen today for physical therapy evaluation and treatment for primarily left leg pain. Her pain seems mainly related to previous hamstring injury that resulted in posterior thigh pain, LLE weakness and compensations with gait and other functional tasks such  as stairs and squatting. She would benefit from continued skilled PT to progress her strength and mobility in order to improve her walking tolerance, ability to perform squat and lifting tasks, stair negotiation, and return to prior active lifestyle.  Patient also demonstrates symptoms consistent with left gluteal tendinopathy and possible lumbar radicular symptoms.   OBJECTIVE IMPAIRMENTS Abnormal gait, decreased activity tolerance, difficulty walking, decreased strength, impaired flexibility, improper body mechanics, and pain.   ACTIVITY LIMITATIONS lifting, bending, standing, squatting, stairs, and locomotion level  PARTICIPATION LIMITATIONS: cleaning, shopping, community activity, and yard work  PERSONAL FACTORS Past/current experiences and Time since onset of injury/illness/exacerbation are also affecting patient's functional outcome.   REHAB POTENTIAL: Good  CLINICAL DECISION MAKING: Stable/uncomplicated  EVALUATION COMPLEXITY: Low   GOALS: Goals reviewed with patient? Yes  SHORT TERM GOALS: Target date: 10/01/2021   Patient will be I with initial HEP in order to progress with therapy. Baseline: HEP provided at eval Goal status: INITIAL  2.  PT will review FOTO with patient by 3rd visit in order to understand expected progress and outcome with therapy. Baseline: FOTO assessed at eval Goal status: INITIAL  3.  Patient will be able to negotiate 1 flight of stairs reciprocally using railing as needed to improve household mobility Baseline: patient uses step-to pattern for stairs Goal status: INITIAL  LONG TERM GOALS: Target date: 10/29/2021   Patient will be I with final HEP to maintain progress from PT. Baseline: HEP provided at eval Goal status: INITIAL  2.  Patient will report >/= 68% status on FOTO to indicate improved functional ability. Baseline: 57% functional status Goal status: INITIAL  3.  Patient will demonstrate improved strength of left knee >/= 4+/5 MMT and  hip >/= 4/5 MMT in order to improve walking tolerance for community access Baseline: patient demonstrates left knee and hip strength deficits Goal status: INITIAL  4.  Patient will report </= 1/10 pain well all activities in order to reduce functional limitations and return to prior activity level Baseline: patient reports 1/10 pain at rest that increases with activity Goal status: INITIAL   PLAN: PT FREQUENCY: 1x/week  PT DURATION: 8 weeks  PLANNED INTERVENTIONS: Therapeutic exercises, Therapeutic activity, Neuromuscular re-education, Balance training, Gait training, Patient/Family education, Joint manipulation, Joint mobilization, Stair training, Aquatic Therapy, Dry Needling, Spinal manipulation, Spinal mobilization, Cryotherapy, Moist heat, Taping, Manual therapy, and Re-evaluation  PLAN FOR NEXT SESSION: Review HEP and progress PRN, hip/core/left quad and hamstring strengthening, left hamstring flexibility, squat and hip hinge mechanics, stair training, review FOTO   Hilda Blades, PT, DPT, LAT, ATC 09/03/21  11:53 AM Phone: [phone] Fax: [phone]

## 2021-09-03 ENCOUNTER — Other Ambulatory Visit: Payer: Self-pay

## 2021-09-03 ENCOUNTER — Encounter: Payer: Self-pay | Admitting: Physical Therapy

## 2021-09-03 ENCOUNTER — Ambulatory Visit: Payer: Medicare Other | Admitting: Physical Therapy

## 2021-09-03 DIAGNOSIS — H903 Sensorineural hearing loss, bilateral: Secondary | ICD-10-CM | POA: Diagnosis not present

## 2021-09-03 DIAGNOSIS — R2689 Other abnormalities of gait and mobility: Secondary | ICD-10-CM

## 2021-09-03 DIAGNOSIS — M79605 Pain in left leg: Secondary | ICD-10-CM

## 2021-09-03 DIAGNOSIS — M6281 Muscle weakness (generalized): Secondary | ICD-10-CM

## 2021-09-03 NOTE — Patient Instructions (Signed)
Access Code: [email or web address] URL: [email or web address] Date: 09/03/2021 Prepared by: Hilda Blades  Exercises - Active Straight Leg Raise with Quad Set  - 1 x daily - 2 sets - 10 reps - Supine Hamstring Stretch  - 1 x daily - 2 sets - 10 reps - Bridge  - 1 x daily - 2 sets - 10 reps - Clam with Resistance  - 1 x daily - 2 sets - 10 reps - Prone Knee Flexion  - 1 x daily - 2 sets - 10 reps

## 2021-09-10 ENCOUNTER — Ambulatory Visit: Payer: Medicare Other | Admitting: Physical Therapy

## 2021-09-10 ENCOUNTER — Encounter: Payer: Self-pay | Admitting: Physical Therapy

## 2021-09-10 DIAGNOSIS — M79605 Pain in left leg: Secondary | ICD-10-CM

## 2021-09-10 DIAGNOSIS — M6281 Muscle weakness (generalized): Secondary | ICD-10-CM

## 2021-09-10 DIAGNOSIS — H903 Sensorineural hearing loss, bilateral: Secondary | ICD-10-CM | POA: Diagnosis not present

## 2021-09-10 DIAGNOSIS — R2689 Other abnormalities of gait and mobility: Secondary | ICD-10-CM

## 2021-09-10 NOTE — Therapy (Signed)
OUTPATIENT PHYSICAL THERAPY TREATMENT NOTE   Patient Name: Patricia Chambers MRN: [id number] DOB:July 25, 1950, 71 y.o., female Today's Date: 09/10/2021  PCP: Myrlene Broker, MD   REFERRING PROVIDER: Myrlene Broker, MD  END OF SESSION:   PT End of Session - 09/10/21 0851     Visit Number 2    Number of Visits 8    Date for PT Re-Evaluation 10/29/21    Authorization Type [employer] MEDICARE    Authorization Time Period FOTO by 6th, KX by 15th    Progress Note Due on Visit 10    PT Start Time 0846    PT Stop Time 0930    PT Time Calculation (min) 44 min             Past Medical History:  Diagnosis Date   Allergy    Arthritis    Asthma    Cataract    Colon polyp    patient states she had 1 removed 20 years ago   Essential hypertension 06/21/2013   FHx: migraine headaches    Hx of colonic polyps    Hyperlipidemia    Hypertension    Sarcoidosis    Thyroid disease    Past Surgical History:  Procedure Laterality Date   COLONOSCOPY WITH PROPOFOL N/A 12/17/2020   Procedure: COLONOSCOPY WITH PROPOFOL;  Surgeon: Napoleon Form, MD;  Location: [facility];  Service: Endoscopy;  Laterality: N/A;   ENDOSCOPIC MUCOSAL RESECTION N/A 12/17/2020   Procedure: ENDOSCOPIC MUCOSAL RESECTION;  Surgeon: Napoleon Form, MD;  Location: [facility];  Service: Endoscopy;  Laterality: N/A;   LEFT HEART CATHETERIZATION WITH CORONARY ANGIOGRAM N/A 06/24/2013   Procedure: LEFT HEART CATHETERIZATION WITH CORONARY ANGIOGRAM;  Surgeon: Micheline Chapman, MD;  Location: [facility] CATH LAB;  Service: Cardiovascular;  Laterality: N/A;   POLYPECTOMY  12/17/2020   Procedure: POLYPECTOMY;  Surgeon: Napoleon Form, MD;  Location: [facility];  Service: Endoscopy;;   SUBMUCOSAL LIFTING INJECTION  12/17/2020   Procedure: SUBMUCOSAL LIFTING INJECTION;  Surgeon: Napoleon Form, MD;  Location: [facility];  Service: Endoscopy;;   TEMPOROMANDIBULAR JOINT SURGERY     Patient Active Problem List    Diagnosis Date Noted   Left leg pain 08/20/2021   Bilateral hearing loss 08/20/2021   Memory loss 04/27/2021   Hypothyroidism 04/27/2021   Aortic atherosclerosis (HCC) 04/27/2021   Urinary frequency 04/27/2021   Adenomatous polyp of transverse colon    Polyp of ascending colon    Atypical chest pain 12/20/2018   DOE (dyspnea on exertion) 11/07/2018   Dyslipidemia 06/21/2013   Family history of coronary artery disease 06/21/2013    REFERRING DIAG: Left leg pain   THERAPY DIAG:  Pain in left leg  Muscle weakness (generalized)  Other abnormalities of gait and mobility  Rationale for Evaluation and Treatment Rehabilitation  PERTINENT HISTORY: 05/2020 left hamstring tear, previous right ankle fracture 2 years ago, history of left hip bursitis, chronic low back pain   PRECAUTIONS: None   SUBJECTIVE: Some of the exercises kicked up my pain. I have continued to walk and do some yard work. I used some ice on the back of my leg after yard work and it helped. There is a certain amount of activity or exercise that is too much but I have not figured it out yet.   PAIN:  Are you having pain? Yes:  NPRS scale: 1/10 Pain location: Left leg (hamstring) Pain description: Burning Aggravating factors: Constant, walking, stairs, squat Relieving factors: Medication, rest  OBJECTIVE: (objective measures completed at initial evaluation unless otherwise dated)   PATIENT SURVEYS:  FOTO 57% functional status   COGNITION: Overall cognitive status: Within functional limits for tasks assessed                          SENSATION: WFL   MUSCLE LENGTH: Left hamstring length deficit   PALPATION: Tender throughout left proximal-mid hamstring, left hip greater trochanteric region   LOWER EXTREMITY ROM:                       Hip and knee ROM grossly WFL   LOWER EXTREMITY MMT:   MMT Right eval Left eval  Hip flexion 4 4-  Hip extension 4- 3-  Hip abduction 4- 3+  Knee flexion 5 4-   Knee extension 5 4  Ankle dorsiflexion 5 5  Ankle plantarflexion 4 4    FUNCTIONAL TESTS:  Squat: patient demonstrates decreased depth with weight shift toward right   GAIT: Assistive device utilized: None Level of assistance: Complete Independence Comments: Antalgic on left, coxalgic gait on left     TODAY'S TREATMENT: [facility] Adult PT Treatment:                                                DATE: 09/10/21 Therapeutic Exercise: Nustep L3 UE/LE x 5 minutes  Seated hamstring stretch 30 sec x 2  STS from MAT x 10 - cues for equal weight Pone alternating hamstring curls - cues for eccentric control 123 count S/L red band clam x 10 each  Supine red band clam x 10 Bridge with red band  x 10 Supine hamstring stretch with and without strap  SLR 10 x 2 left   INITIAL TREATMENT: SLR x 10 Supine active hamstring stretch x 10 Bridge x 10 Side clamshell with red x 10 Prone hamstring curl x 10     PATIENT EDUCATION:  Education details: Exam findings, POC, HEP Person educated: Patient Education method: Explanation, Demonstration, Tactile cues, Verbal cues, and Handouts Education comprehension: verbalized understanding, returned demonstration, verbal cues required, tactile cues required, and needs further education   HOME EXERCISE PROGRAM: Access Code: [email or web address] URL: [email or web address] Date: 09/10/2021 Prepared by: Hessie Diener  Exercises - Active Straight Leg Raise with Quad Set  - 1 x daily - 2 sets - 10 reps - Supine Hamstring Stretch  - 1 x daily - 2 sets - 10 reps - Bridge  - 1 x daily - 2 sets - 10 reps - Clam with Resistance  - 1 x daily - 2 sets - 10 reps - Prone Knee Flexion  - 1 x daily - 2 sets - 10 reps - Sit to stand with control  - 1 x daily - 7 x weekly - 1-2 sets - 10 reps     ASSESSMENT: CLINICAL IMPRESSION: Patient is a 71 y.o. female who was seen today for physical therapy treatment for primarily left leg pain. Her pain seems mainly  related to previous hamstring injury that resulted in posterior thigh pain, LLE weakness and compensations with gait and other functional tasks such as stairs and squatting. She reports some increased pain with HEP including increased left hip pain. She has been compliant with HEP. Discussed activity pacing and pain management techniques to reduce episodes of increased pain.  Introduced Risk manager with cues for equal weight. She completed this with min discomfort in left hamstring. Updated HEP and reviewed FOTO score and predictions, STG# 2 met.  She would benefit from continued skilled PT to progress her strength and mobility in order to improve her walking tolerance, ability to perform squat and lifting tasks, stair negotiation, and return to prior active lifestyle.        OBJECTIVE IMPAIRMENTS Abnormal gait, decreased activity tolerance, difficulty walking, decreased strength, impaired flexibility, improper body mechanics, and pain.    ACTIVITY LIMITATIONS lifting, bending, standing, squatting, stairs, and locomotion level   PARTICIPATION LIMITATIONS: cleaning, shopping, community activity, and yard work   PERSONAL FACTORS Past/current experiences and Time since onset of injury/illness/exacerbation are also affecting patient's functional outcome.    REHAB POTENTIAL: Good   CLINICAL DECISION MAKING: Stable/uncomplicated   EVALUATION COMPLEXITY: Low     GOALS: Goals reviewed with patient? Yes   SHORT TERM GOALS: Target date: 10/01/2021    Patient will be I with initial HEP in order to progress with therapy. Baseline: HEP provided at eval Goal status: INITIAL   2.  PT will review FOTO with patient by 3rd visit in order to understand expected progress and outcome with therapy. Baseline: FOTO assessed at eval Goal status: MET   3.  Patient will be able to negotiate 1 flight of stairs reciprocally using railing as needed to improve household mobility Baseline: patient uses step-to pattern  for stairs Goal status: INITIAL   LONG TERM GOALS: Target date: 10/29/2021    Patient will be I with final HEP to maintain progress from PT. Baseline: HEP provided at eval Goal status: INITIAL   2.  Patient will report >/= 68% status on FOTO to indicate improved functional ability. Baseline: 57% functional status Goal status: INITIAL   3.  Patient will demonstrate improved strength of left knee >/= 4+/5 MMT and hip >/= 4/5 MMT in order to improve walking tolerance for community access Baseline: patient demonstrates left knee and hip strength deficits Goal status: INITIAL   4.  Patient will report </= 1/10 pain well all activities in order to reduce functional limitations and return to prior activity level Baseline: patient reports 1/10 pain at rest that increases with activity Goal status: INITIAL     PLAN: PT FREQUENCY: 1x/week   PT DURATION: 8 weeks   PLANNED INTERVENTIONS: Therapeutic exercises, Therapeutic activity, Neuromuscular re-education, Balance training, Gait training, Patient/Family education, Joint manipulation, Joint mobilization, Stair training, Aquatic Therapy, Dry Needling, Spinal manipulation, Spinal mobilization, Cryotherapy, Moist heat, Taping, Manual therapy, and Re-evaluation   PLAN FOR NEXT SESSION: Review HEP and progress PRN, hip/core/left quad and hamstring strengthening, left hamstring flexibility, squat and hip hinge mechanics, stair training      Hessie Diener, PTA 09/10/21 9:50 AM Phone: [phone] Fax: [phone]

## 2021-09-13 ENCOUNTER — Encounter: Payer: Self-pay | Admitting: Physical Therapy

## 2021-09-13 ENCOUNTER — Ambulatory Visit: Payer: Medicare Other | Admitting: Physical Therapy

## 2021-09-13 ENCOUNTER — Other Ambulatory Visit: Payer: Self-pay

## 2021-09-13 DIAGNOSIS — R2689 Other abnormalities of gait and mobility: Secondary | ICD-10-CM

## 2021-09-13 DIAGNOSIS — H903 Sensorineural hearing loss, bilateral: Secondary | ICD-10-CM | POA: Diagnosis not present

## 2021-09-13 DIAGNOSIS — M79605 Pain in left leg: Secondary | ICD-10-CM

## 2021-09-13 DIAGNOSIS — M6281 Muscle weakness (generalized): Secondary | ICD-10-CM

## 2021-09-13 NOTE — Therapy (Signed)
OUTPATIENT PHYSICAL THERAPY TREATMENT NOTE   Patient Name: Patricia Chambers MRN: [id number] DOB:[birth year], 71 y.o., female Today's Date: 09/13/2021  PCP: Hoyt Koch, MD   REFERRING PROVIDER: Hoyt Koch, MD  END OF SESSION:   PT End of Session - 09/13/21 0922     Visit Number 3    Number of Visits 8    Date for PT Re-Evaluation 10/29/21    Authorization Type [employer] MEDICARE    Authorization Time Period FOTO by 6th, KX by 15th    Progress Note Due on Visit 10    PT Start Time 0916    PT Stop Time 1000    PT Time Calculation (min) 44 min    Activity Tolerance Patient tolerated treatment well    Behavior During Therapy [facility] for tasks assessed/performed              Past Medical History:  Diagnosis Date   Allergy    Arthritis    Asthma    Cataract    Colon polyp    patient states she had 1 removed 20 years ago   Essential hypertension 06/21/2013   FHx: migraine headaches    Hx of colonic polyps    Hyperlipidemia    Hypertension    Sarcoidosis    Thyroid disease    Past Surgical History:  Procedure Laterality Date   COLONOSCOPY WITH PROPOFOL N/A 12/17/2020   Procedure: COLONOSCOPY WITH PROPOFOL;  Surgeon: Mauri Pole, MD;  Location: [facility];  Service: Endoscopy;  Laterality: N/A;   ENDOSCOPIC MUCOSAL RESECTION N/A 12/17/2020   Procedure: ENDOSCOPIC MUCOSAL RESECTION;  Surgeon: Mauri Pole, MD;  Location: [facility];  Service: Endoscopy;  Laterality: N/A;   LEFT HEART CATHETERIZATION WITH CORONARY ANGIOGRAM N/A 06/24/2013   Procedure: LEFT HEART CATHETERIZATION WITH CORONARY ANGIOGRAM;  Surgeon: Blane Ohara, MD;  Location: [facility] CATH LAB;  Service: Cardiovascular;  Laterality: N/A;   POLYPECTOMY  12/17/2020   Procedure: POLYPECTOMY;  Surgeon: Mauri Pole, MD;  Location: [facility];  Service: Endoscopy;;   SUBMUCOSAL LIFTING INJECTION  12/17/2020   Procedure: SUBMUCOSAL LIFTING INJECTION;  Surgeon: Mauri Pole,  MD;  Location: [facility];  Service: Endoscopy;;   TEMPOROMANDIBULAR JOINT SURGERY     Patient Active Problem List   Diagnosis Date Noted   Left leg pain 08/20/2021   Bilateral hearing loss 08/20/2021   Memory loss 04/27/2021   Hypothyroidism 04/27/2021   Aortic atherosclerosis ([place]) 04/27/2021   Urinary frequency 04/27/2021   Adenomatous polyp of transverse colon    Polyp of ascending colon    Atypical chest pain 12/20/2018   DOE (dyspnea on exertion) 11/07/2018   Dyslipidemia 06/21/2013   Family history of coronary artery disease 06/21/2013    REFERRING DIAG: Left leg pain   THERAPY DIAG:  Pain in left leg  Muscle weakness (generalized)  Other abnormalities of gait and mobility  Rationale for Evaluation and Treatment Rehabilitation  PERTINENT HISTORY: 05/2020 left hamstring tear, previous right ankle fracture 2 years ago, history of left hip bursitis, chronic low back pain   PRECAUTIONS: None    SUBJECTIVE: Patient reports she worked in the yard and did some walking over the weekend. She does not having to take Tylenol at night to help with the pain/soreness.   PAIN:  Are you having pain? Yes:  NPRS scale: 5/10 Pain location: Left leg (hamstring) Pain description: Burning Aggravating factors: Constant, walking, stairs, squat Relieving factors: Medication, rest    OBJECTIVE: (objective measures completed  at initial evaluation unless otherwise dated) PATIENT SURVEYS:  FOTO 57% functional status   MUSCLE LENGTH: Left hamstring length deficit   PALPATION: Tender throughout left proximal-mid hamstring, left hip greater trochanteric region   LOWER EXTREMITY MMT:   MMT Right eval Left eval  Hip flexion 4 4-  Hip extension 4- 3-  Hip abduction 4- 3+  Knee flexion 5 4-  Knee extension 5 4  Ankle dorsiflexion 5 5  Ankle plantarflexion 4 4    FUNCTIONAL TESTS:  Squat: patient demonstrates decreased depth with weight shift toward right   GAIT: Assistive  device utilized: None Level of assistance: Complete Independence Comments: Antalgic on left, coxalgic gait on left     TODAY'S TREATMENT: [facility] Adult PT Treatment:                                                DATE: 09/13/21 Therapeutic Exercise: Nustep L6 x 5 min with UE/LE while taking subjective Supine SKTC stretch 2 x 15 sec each Supine active hamstring stretch 2 x 10 each SLR x 10, with 2# x 10 Piriformis stretch 2 x 20 sec each Bridge 2 x 10 Sidelying hip abduction 2 x 10 (left only) Prone hamstring curl 2 x 10 (left only) Prone hip extension 2 x 10 each Sit to stand 2 x 10 Forward step-up 4" box 2 x 10 each (left only)   [facility] Adult PT Treatment:                                                DATE: 09/10/21 Therapeutic Exercise: Nustep L3 UE/LE x 5 minutes  Seated hamstring stretch 30 sec x 2  STS from MAT x 10 - cues for equal weight Pone alternating hamstring curls - cues for eccentric control 123 count S/L red band clam x 10 each  Supine red band clam x 10 Bridge with red band  x 10 Supine hamstring stretch with and without strap  SLR 10 x 2 left  [facility] Adult PT Treatment:                                                DATE: 09/03/21 Therapeutic Exercise: SLR x 10 Supine active hamstring stretch x 10 Bridge x 10 Side clamshell with red x 10 Prone hamstring curl x 10     PATIENT EDUCATION:  Education details: HEP update Person educated: Patient Education method: Explanation, Demonstration, Tactile cues, Verbal cues, and Handouts Education comprehension: verbalized understanding, returned demonstration, verbal cues required, tactile cues required, and needs further education   HOME EXERCISE PROGRAM: Access Code: [email or web address]     ASSESSMENT: CLINICAL IMPRESSION: Patient tolerated therapy well with no adverse effects. Therapy focused on continued left hamstring and hip mobility, and progression of strengthening with good tolerance. She did continue to report left  hip tightness so incorporated piriformis stretch with good benefit. She seems to be progressing well with light strengthening but still reports soreness and discomfort with exercises so did not increase resistance to avoid exacerbation. She was able to progress to small step-ups without increased pain. She   would benefit from continued skilled PT to progress her strength and mobility in order to improve her walking tolerance, ability to perform squat and lifting tasks, stair negotiation, and return to prior active lifestyle.    OBJECTIVE IMPAIRMENTS Abnormal gait, decreased activity tolerance, difficulty walking, decreased strength, impaired flexibility, improper body mechanics, and pain.    ACTIVITY LIMITATIONS lifting, bending, standing, squatting, stairs, and locomotion level   PARTICIPATION LIMITATIONS: cleaning, shopping, community activity, and yard work   PERSONAL FACTORS Past/current experiences and Time since onset of injury/illness/exacerbation are also affecting patient's functional outcome.      GOALS: Goals reviewed with patient? Yes   SHORT TERM GOALS: Target date: 10/01/2021    Patient will be I with initial HEP in order to progress with therapy. Baseline: HEP provided at eval Goal status: ONGOING   2.  PT will review FOTO with patient by 3rd visit in order to understand expected progress and outcome with therapy. Baseline: FOTO assessed at eval Goal status: MET   3.  Patient will be able to negotiate 1 flight of stairs reciprocally using railing as needed to improve household mobility Baseline: patient uses step-to pattern for stairs Goal status: INITIAL   LONG TERM GOALS: Target date: 10/29/2021    Patient will be I with final HEP to maintain progress from PT. Baseline: HEP provided at eval Goal status: INITIAL   2.  Patient will report >/= 68% status on FOTO to indicate improved functional ability. Baseline: 57% functional status Goal status: INITIAL   3.  Patient  will demonstrate improved strength of left knee >/= 4+/5 MMT and hip >/= 4/5 MMT in order to improve walking tolerance for community access Baseline: patient demonstrates left knee and hip strength deficits Goal status: INITIAL   4.  Patient will report </= 1/10 pain well all activities in order to reduce functional limitations and return to prior activity level Baseline: patient reports 1/10 pain at rest that increases with activity Goal status: INITIAL     PLAN: PT FREQUENCY: 1x/week   PT DURATION: 8 weeks   PLANNED INTERVENTIONS: Therapeutic exercises, Therapeutic activity, Neuromuscular re-education, Balance training, Gait training, Patient/Family education, Joint manipulation, Joint mobilization, Stair training, Aquatic Therapy, Dry Needling, Spinal manipulation, Spinal mobilization, Cryotherapy, Moist heat, Taping, Manual therapy, and Re-evaluation   PLAN FOR NEXT SESSION: Review HEP and progress PRN, hip/core/left quad and hamstring strengthening, left hamstring flexibility, squat and hip hinge mechanics, stair training      Hilda Blades, PT, DPT, LAT, ATC 09/13/21  10:03 AM Phone: [phone] Fax: [phone]

## 2021-09-13 NOTE — Patient Instructions (Signed)
Access Code: [email or web address] URL: [email or web address] Date: 09/13/2021 Prepared by: Hilda Blades  Exercises - Active Straight Leg Raise with Quad Set  - 1 x daily - 2 sets - 10 reps - Supine Hamstring Stretch  - 1 x daily - 2 sets - 10 reps - Supine Piriformis Stretch with Foot on Ground  - 1 x daily - 3 reps - 30 seconds hold - Bridge  - 1 x daily - 2 sets - 10 reps - Clam with Resistance  - 1 x daily - 2 sets - 10 reps - Prone Knee Flexion  - 1 x daily - 2 sets - 10 reps - Sit to stand with control  - 1 x daily - 7 x weekly - 1-2 sets - 10 reps

## 2021-09-20 ENCOUNTER — Encounter: Payer: Self-pay | Admitting: Physical Therapy

## 2021-09-20 ENCOUNTER — Ambulatory Visit: Payer: Medicare Other | Admitting: Physical Therapy

## 2021-09-20 DIAGNOSIS — M79605 Pain in left leg: Secondary | ICD-10-CM

## 2021-09-20 DIAGNOSIS — H903 Sensorineural hearing loss, bilateral: Secondary | ICD-10-CM | POA: Diagnosis not present

## 2021-09-20 DIAGNOSIS — R2689 Other abnormalities of gait and mobility: Secondary | ICD-10-CM

## 2021-09-20 DIAGNOSIS — M6281 Muscle weakness (generalized): Secondary | ICD-10-CM

## 2021-09-27 NOTE — Therapy (Signed)
OUTPATIENT PHYSICAL THERAPY TREATMENT NOTE   Patient Name: Patricia Chambers MRN: [id number] DOB:11/15/1950, 71 y.o., female Today's Date: 09/29/2021  PCP: Hoyt Koch, MD   REFERRING PROVIDER: Hoyt Koch, MD  END OF SESSION:   PT End of Session - 09/29/21 1050     Visit Number 5    Number of Visits 8    Date for PT Re-Evaluation 10/29/21    Authorization Type [employer] MEDICARE    Authorization Time Period FOTO by 6th, KX by 15th    Progress Note Due on Visit 10    PT Start Time 1045    PT Stop Time 1130    PT Time Calculation (min) 45 min    Activity Tolerance Patient tolerated treatment well    Behavior During Therapy [facility] for tasks assessed/performed               Past Medical History:  Diagnosis Date   Allergy    Arthritis    Asthma    Cataract    Colon polyp    patient states she had 1 removed 20 years ago   Essential hypertension 06/21/2013   FHx: migraine headaches    Hx of colonic polyps    Hyperlipidemia    Hypertension    Sarcoidosis    Thyroid disease    Past Surgical History:  Procedure Laterality Date   COLONOSCOPY WITH PROPOFOL N/A 12/17/2020   Procedure: COLONOSCOPY WITH PROPOFOL;  Surgeon: Mauri Pole, MD;  Location: [facility];  Service: Endoscopy;  Laterality: N/A;   ENDOSCOPIC MUCOSAL RESECTION N/A 12/17/2020   Procedure: ENDOSCOPIC MUCOSAL RESECTION;  Surgeon: Mauri Pole, MD;  Location: [facility];  Service: Endoscopy;  Laterality: N/A;   LEFT HEART CATHETERIZATION WITH CORONARY ANGIOGRAM N/A 06/24/2013   Procedure: LEFT HEART CATHETERIZATION WITH CORONARY ANGIOGRAM;  Surgeon: Blane Ohara, MD;  Location: [facility] CATH LAB;  Service: Cardiovascular;  Laterality: N/A;   POLYPECTOMY  12/17/2020   Procedure: POLYPECTOMY;  Surgeon: Mauri Pole, MD;  Location: [facility];  Service: Endoscopy;;   SUBMUCOSAL LIFTING INJECTION  12/17/2020   Procedure: SUBMUCOSAL LIFTING INJECTION;  Surgeon: Mauri Pole,  MD;  Location: [facility];  Service: Endoscopy;;   TEMPOROMANDIBULAR JOINT SURGERY     Patient Active Problem List   Diagnosis Date Noted   Left leg pain 08/20/2021   Bilateral hearing loss 08/20/2021   Memory loss 04/27/2021   Hypothyroidism 04/27/2021   Aortic atherosclerosis ([place]) 04/27/2021   Urinary frequency 04/27/2021   Adenomatous polyp of transverse colon    Polyp of ascending colon    Atypical chest pain 12/20/2018   DOE (dyspnea on exertion) 11/07/2018   Dyslipidemia 06/21/2013   Family history of coronary artery disease 06/21/2013    REFERRING DIAG: Left leg pain   THERAPY DIAG:  Pain in left leg  Muscle weakness (generalized)  Other abnormalities of gait and mobility  Rationale for Evaluation and Treatment Rehabilitation  PERTINENT HISTORY: 05/2020 left hamstring tear, previous right ankle fracture 2 years ago, history of left hip bursitis, chronic low back pain   PRECAUTIONS: None    SUBJECTIVE: Patient reports she is doing well. Denies any pain currently, but does note she had some pain last night after she walked for about 40 minutes. She reports that her left hip will bother her from time to time but when she doesn't do much it wont bother her at all.  PAIN:  Are you having pain? Yes:  NPRS scale: 0/10 Pain location:  Left leg (hamstring) Pain description: Burning Aggravating factors: Constant, walking, stairs, squat Relieving factors: Medication, rest    OBJECTIVE: (objective measures completed at initial evaluation unless otherwise dated) PATIENT SURVEYS:  FOTO 57% functional status   MUSCLE LENGTH: Left hamstring length deficit   PALPATION: Tender throughout left proximal-mid hamstring, left hip greater trochanteric region   LOWER EXTREMITY MMT:   MMT Right eval Left eval Rt / Lt 09/29/2021  Hip flexion 4 4-   Hip extension 4- 3- 4- / 3+  Hip abduction 4- 3+ 4- / 4-  Knee flexion 5 4- 5 / 4  Knee extension 5 4   Ankle dorsiflexion 5  5   Ankle plantarflexion 4 4     FUNCTIONAL TESTS:  Squat: patient demonstrates decreased depth with weight shift toward right   GAIT: Assistive device utilized: None Level of assistance: Complete Independence Comments: Antalgic on left, coxalgic gait on left     TODAY'S TREATMENT: [facility] Adult PT Treatment:                                                DATE: 09/29/21 Therapeutic Exercise: Nustep L6 x 5 min with UE/LE while taking subjective Bridge x 10 Sidelying hip abduction 2 x 10 each Prone hip extension 2 x 10 each Figure-4 bridge x 10 each Sit to stand holding 10# KB 2 x 10 Forward step-up 6" box 2 x 10 each (left only) Lateral step-up 6" box 2 x 10 each (left only) SLS 3 x 30 sec each   [facility] Adult PT Treatment:                                                DATE: 09/20/21 Therapeutic Activity: Floor transfer  Therapeutic Exercise: Rec bike L1 x 5 minutes Hamstring curl OMEGA 20# x 15  Sit to stand 2 x 10 10# KB 6 inch step up 10 x 1 each Bridge x 10  Bridge with DF x 10   [facility] Adult PT Treatment:                                                DATE: 09/13/21 Therapeutic Exercise: Nustep L6 x 5 min with UE/LE while taking subjective Supine SKTC stretch 2 x 15 sec each Supine active hamstring stretch 2 x 10 each SLR x 10, with 2# x 10 Piriformis stretch 2 x 20 sec each Bridge 2 x 10 Sidelying hip abduction 2 x 10 (left only) Prone hamstring curl 2 x 10 (left only) Prone hip extension 2 x 10 each Sit to stand 2 x 10 Forward step-up 4" box 2 x 10 each (left only)   PATIENT EDUCATION:  Education details: HEP update Person educated: Patient Education method: Explanation, Demonstration, Tactile cues, Verbal cues, and Handouts Education comprehension: verbalized understanding, returned demonstration, verbal cues required, tactile cues required, and needs further education   HOME EXERCISE PROGRAM: Access Code: [email or web address]     ASSESSMENT: CLINICAL  IMPRESSION: Patient tolerated therapy well with no adverse effects. She does exhibit improved left hip and hamstring strength and is tolerating  progressions in strength well. Therapy continues to focus primarily on strengthening and pelvic control. She did report left lateral hip and knee pain with standing exercises this visit. Incorporated SLS to improve lateral pelvic control and endurance. Updated HEP with good tolerance. She would benefit from continued skilled PT to progress her strength and mobility in order to improve her walking tolerance, ability to perform squat and lifting tasks, stair negotiation, and return to prior active lifestyle.    OBJECTIVE IMPAIRMENTS Abnormal gait, decreased activity tolerance, difficulty walking, decreased strength, impaired flexibility, improper body mechanics, and pain.    ACTIVITY LIMITATIONS lifting, bending, standing, squatting, stairs, and locomotion level   PARTICIPATION LIMITATIONS: cleaning, shopping, community activity, and yard work   PERSONAL FACTORS Past/current experiences and Time since onset of injury/illness/exacerbation are also affecting patient's functional outcome.      GOALS: Goals reviewed with patient? Yes   SHORT TERM GOALS: Target date: 10/01/2021    Patient will be I with initial HEP in order to progress with therapy. Baseline: HEP provided at eval 09/29/2021: independent with initial HEP Goal status: MET   2.  PT will review FOTO with patient by 3rd visit in order to understand expected progress and outcome with therapy. Baseline: FOTO assessed at eval Goal status: MET   3.  Patient will be able to negotiate 1 flight of stairs reciprocally using railing as needed to improve household mobility Baseline: patient uses step-to pattern for stairs Goal status: INITIAL   LONG TERM GOALS: Target date: 10/29/2021    Patient will be I with final HEP to maintain progress from PT. Baseline: HEP provided at eval Goal status:  INITIAL   2.  Patient will report >/= 68% status on FOTO to indicate improved functional ability. Baseline: 57% functional status Goal status: INITIAL   3.  Patient will demonstrate improved strength of left knee >/= 4+/5 MMT and hip >/= 4/5 MMT in order to improve walking tolerance for community access Baseline: patient demonstrates left knee and hip strength deficits Goal status: INITIAL   4.  Patient will report </= 1/10 pain well all activities in order to reduce functional limitations and return to prior activity level Baseline: patient reports 1/10 pain at rest that increases with activity Goal status: INITIAL     PLAN: PT FREQUENCY: 1x/week   PT DURATION: 8 weeks   PLANNED INTERVENTIONS: Therapeutic exercises, Therapeutic activity, Neuromuscular re-education, Balance training, Gait training, Patient/Family education, Joint manipulation, Joint mobilization, Stair training, Aquatic Therapy, Dry Needling, Spinal manipulation, Spinal mobilization, Cryotherapy, Moist heat, Taping, Manual therapy, and Re-evaluation   PLAN FOR NEXT SESSION: Review HEP and progress PRN, hip/core/left quad and hamstring strengthening, left hamstring flexibility, squat and hip hinge mechanics, stair training     Hilda Blades, PT, DPT, LAT, ATC 09/29/21  11:30 AM Phone: [phone] Fax: [phone]

## 2021-09-29 ENCOUNTER — Encounter: Payer: Self-pay | Admitting: Physical Therapy

## 2021-09-29 ENCOUNTER — Other Ambulatory Visit: Payer: Self-pay

## 2021-09-29 ENCOUNTER — Ambulatory Visit: Payer: Medicare Other | Attending: Audiologist | Admitting: Physical Therapy

## 2021-09-29 DIAGNOSIS — M6281 Muscle weakness (generalized): Secondary | ICD-10-CM | POA: Diagnosis present

## 2021-09-29 DIAGNOSIS — R2689 Other abnormalities of gait and mobility: Secondary | ICD-10-CM | POA: Insufficient documentation

## 2021-09-29 DIAGNOSIS — M79605 Pain in left leg: Secondary | ICD-10-CM | POA: Diagnosis not present

## 2021-09-29 NOTE — Patient Instructions (Signed)
Access Code: [email or web address] URL: [email or web address] Date: 09/29/2021 Prepared by: Hilda Blades  Exercises - Active Straight Leg Raise with Quad Set  - 1 x daily - 2 sets - 10 reps - Supine Hamstring Stretch  - 1 x daily - 2 sets - 10 reps - Supine Piriformis Stretch with Foot on Ground  - 1 x daily - 3 reps - 30 seconds hold - Figure 4 Bridge  - 1 x daily - 2 sets - 10 reps - Clam with Resistance  - 1 x daily - 2 sets - 10 reps - Prone Knee Flexion  - 1 x daily - 2 sets - 10 reps - Sit to stand with control  - 1 x daily - 2 sets - 10 reps - Single Leg Stance with Support  - 1 x daily - 3 reps - 30 seconds hold

## 2021-10-05 ENCOUNTER — Other Ambulatory Visit: Payer: Self-pay

## 2021-10-05 ENCOUNTER — Encounter: Payer: Self-pay | Admitting: Physical Therapy

## 2021-10-05 ENCOUNTER — Ambulatory Visit: Payer: Medicare Other | Admitting: Physical Therapy

## 2021-10-05 DIAGNOSIS — R2689 Other abnormalities of gait and mobility: Secondary | ICD-10-CM

## 2021-10-05 DIAGNOSIS — M6281 Muscle weakness (generalized): Secondary | ICD-10-CM

## 2021-10-05 DIAGNOSIS — M79605 Pain in left leg: Secondary | ICD-10-CM | POA: Diagnosis not present

## 2021-10-05 NOTE — Therapy (Signed)
OUTPATIENT PHYSICAL THERAPY TREATMENT NOTE   Patient Name: Patricia Chambers MRN: [id number] DOB:1950/08/10, 71 y.o., female Today's Date: 10/05/2021  PCP: Hoyt Koch, MD   REFERRING PROVIDER: Hoyt Koch, MD   END OF SESSION:   PT End of Session - 10/05/21 1009     Visit Number 6    Number of Visits 8    Date for PT Re-Evaluation 10/29/21    Authorization Type [employer] MEDICARE    Authorization Time Period FOTO by 6th, KX by 15th    Progress Note Due on Visit 10    PT Start Time 1005    PT Stop Time 1045    PT Time Calculation (min) 40 min    Activity Tolerance Patient tolerated treatment well    Behavior During Therapy [facility] for tasks assessed/performed                Past Medical History:  Diagnosis Date   Allergy    Arthritis    Asthma    Cataract    Colon polyp    patient states she had 1 removed 20 years ago   Essential hypertension 06/21/2013   FHx: migraine headaches    Hx of colonic polyps    Hyperlipidemia    Hypertension    Sarcoidosis    Thyroid disease    Past Surgical History:  Procedure Laterality Date   COLONOSCOPY WITH PROPOFOL N/A 12/17/2020   Procedure: COLONOSCOPY WITH PROPOFOL;  Surgeon: Mauri Pole, MD;  Location: [facility];  Service: Endoscopy;  Laterality: N/A;   ENDOSCOPIC MUCOSAL RESECTION N/A 12/17/2020   Procedure: ENDOSCOPIC MUCOSAL RESECTION;  Surgeon: Mauri Pole, MD;  Location: [facility];  Service: Endoscopy;  Laterality: N/A;   LEFT HEART CATHETERIZATION WITH CORONARY ANGIOGRAM N/A 06/24/2013   Procedure: LEFT HEART CATHETERIZATION WITH CORONARY ANGIOGRAM;  Surgeon: Blane Ohara, MD;  Location: [facility] CATH LAB;  Service: Cardiovascular;  Laterality: N/A;   POLYPECTOMY  12/17/2020   Procedure: POLYPECTOMY;  Surgeon: Mauri Pole, MD;  Location: [facility];  Service: Endoscopy;;   SUBMUCOSAL LIFTING INJECTION  12/17/2020   Procedure: SUBMUCOSAL LIFTING INJECTION;  Surgeon: Mauri Pole, MD;  Location: [facility];  Service: Endoscopy;;   TEMPOROMANDIBULAR JOINT SURGERY     Patient Active Problem List   Diagnosis Date Noted   Left leg pain 08/20/2021   Bilateral hearing loss 08/20/2021   Memory loss 04/27/2021   Hypothyroidism 04/27/2021   Aortic atherosclerosis ([place]) 04/27/2021   Urinary frequency 04/27/2021   Adenomatous polyp of transverse colon    Polyp of ascending colon    Atypical chest pain 12/20/2018   DOE (dyspnea on exertion) 11/07/2018   Dyslipidemia 06/21/2013   Family history of coronary artery disease 06/21/2013    REFERRING DIAG: Left leg pain   THERAPY DIAG:  Pain in left leg  Muscle weakness (generalized)  Other abnormalities of gait and mobility  Rationale for Evaluation and Treatment Rehabilitation  PERTINENT HISTORY: 05/2020 left hamstring tear, previous right ankle fracture 2 years ago, history of left hip bursitis, chronic low back pain   PRECAUTIONS: None    SUBJECTIVE: Patient reports she did not sleep well so is dragging this morning. She reports she is consistent with her exercises and is up to 45 min walking with an incline. She feels that she is definitely getting stronger and there are some days she can negotiate stairs and get in/out of car with no issues, but then there are days where she has  trouble and walks with a limp.  PAIN:  Are you having pain? Yes:  NPRS scale: 0/10 Pain location: Left leg (hamstring) Pain description: Burning Aggravating factors: Constant, walking, stairs, squat Relieving factors: Medication, rest    OBJECTIVE: (objective measures completed at initial evaluation unless otherwise dated) PATIENT SURVEYS:  FOTO 57% functional status  10/05/2021: 66%   MUSCLE LENGTH: Left hamstring length deficit   PALPATION: Tender throughout left proximal-mid hamstring, left hip greater trochanteric region   LOWER EXTREMITY MMT:   MMT Right eval Left eval Rt / Lt 09/29/2021  Hip flexion 4 4-   Hip  extension 4- 3- 4- / 3+  Hip abduction 4- 3+ 4- / 4-  Knee flexion 5 4- 5 / 4  Knee extension 5 4   Ankle dorsiflexion 5 5   Ankle plantarflexion 4 4     FUNCTIONAL TESTS:  Squat: patient demonstrates decreased depth with weight shift toward right   GAIT: Assistive device utilized: None Level of assistance: Complete Independence Comments: Antalgic on left, coxalgic gait on left     TODAY'S TREATMENT: [facility] Adult PT Treatment:                                                DATE: 10/05/21 Therapeutic Exercise: Nustep L6 x 6 min with LE while taking subjective Figure-4 bridge 2 x 10 each Prone straight leg hip extension x 10 each, with 3# x 10 each Prone hamstring curl with 3# 2 x 10 each Sit to stand holding 10# KB 2 x 10 Hip abduction machine 12.5# 2 x 10 each SLS 2 x 30 sec each Deadlift with 25# x 10   [facility] Adult PT Treatment:                                                DATE: 09/29/21 Therapeutic Exercise: Nustep L6 x 5 min with UE/LE while taking subjective Bridge x 10 Sidelying hip abduction 2 x 10 each Prone hip extension 2 x 10 each Figure-4 bridge x 10 each Sit to stand holding 10# KB 2 x 10 Forward step-up 6" box 2 x 10 each (left only) Lateral step-up 6" box 2 x 10 each (left only) SLS 3 x 30 sec each  [facility] Adult PT Treatment:                                                DATE: 09/20/21 Therapeutic Activity: Floor transfer  Therapeutic Exercise: Rec bike L1 x 5 minutes Hamstring curl OMEGA 20# x 15  Sit to stand 2 x 10 10# KB 6 inch step up 10 x 1 each Bridge x 10  Bridge with DF x 10    PATIENT EDUCATION:  Education details: HEP Person educated: Patient Education method: Consulting civil engineer, Media planner, Corporate treasurer cues, Verbal cues Education comprehension: verbalized understanding, returned demonstration, verbal cues required, tactile cues required, and needs further education   HOME EXERCISE PROGRAM: Access Code: [email or web address]     ASSESSMENT: CLINICAL  IMPRESSION: Patient tolerated therapy well with no adverse effects. She reports improvement in her functional status on FOTO  this visit and reports progression in walking and activity level. Therapy continues to focus primarily on progressing her strength and control. Incorporated lifting this visit with good tolerance, she did require cueing for proper hip hinge technique. Patient able to have greater resistance with hip and hamstring strengthening without increase in pain. No changes to HEP this visit. She would benefit from continued skilled PT to progress her strength and mobility in order to improve her walking tolerance, ability to perform squat and lifting tasks, stair negotiation, and return to prior active lifestyle.    OBJECTIVE IMPAIRMENTS Abnormal gait, decreased activity tolerance, difficulty walking, decreased strength, impaired flexibility, improper body mechanics, and pain.    ACTIVITY LIMITATIONS lifting, bending, standing, squatting, stairs, and locomotion level   PARTICIPATION LIMITATIONS: cleaning, shopping, community activity, and yard work   PERSONAL FACTORS Past/current experiences and Time since onset of injury/illness/exacerbation are also affecting patient's functional outcome.      GOALS: Goals reviewed with patient? Yes   SHORT TERM GOALS: Target date: 10/01/2021    Patient will be I with initial HEP in order to progress with therapy. Baseline: HEP provided at eval 09/29/2021: independent with initial HEP Goal status: MET   2.  PT will review FOTO with patient by 3rd visit in order to understand expected progress and outcome with therapy. Baseline: FOTO assessed at eval Goal status: MET   3.  Patient will be able to negotiate 1 flight of stairs reciprocally using railing as needed to improve household mobility Baseline: patient uses step-to pattern for stairs 10/05/2021: patient reports depends on day/time  Goal status: PARTIALLY MET   LONG TERM GOALS: Target  date: 10/29/2021    Patient will be I with final HEP to maintain progress from PT. Baseline: HEP provided at eval Goal status: INITIAL   2.  Patient will report >/= 68% status on FOTO to indicate improved functional ability. Baseline: 57% functional status 10/05/2021: 66% Goal status: PARTIALLY MET   3.  Patient will demonstrate improved strength of left knee >/= 4+/5 MMT and hip >/= 4/5 MMT in order to improve walking tolerance for community access Baseline: patient demonstrates left knee and hip strength deficits Goal status: INITIAL   4.  Patient will report </= 1/10 pain well all activities in order to reduce functional limitations and return to prior activity level Baseline: patient reports 1/10 pain at rest that increases with activity Goal status: INITIAL     PLAN: PT FREQUENCY: 1x/week   PT DURATION: 8 weeks   PLANNED INTERVENTIONS: Therapeutic exercises, Therapeutic activity, Neuromuscular re-education, Balance training, Gait training, Patient/Family education, Joint manipulation, Joint mobilization, Stair training, Aquatic Therapy, Dry Needling, Spinal manipulation, Spinal mobilization, Cryotherapy, Moist heat, Taping, Manual therapy, and Re-evaluation   PLAN FOR NEXT SESSION: Review HEP and progress PRN, hip/core/left quad and hamstring strengthening, left hamstring flexibility, squat and hip hinge mechanics, stair training     Hilda Blades, PT, DPT, LAT, ATC 10/05/21  10:56 AM Phone: [phone] Fax: [phone]

## 2021-10-11 NOTE — Therapy (Signed)
OUTPATIENT PHYSICAL THERAPY TREATMENT NOTE   Patient Name: Patricia Chambers MRN: [id number] DOB:04/27/1950, 71 y.o., female Today's Date: 10/12/2021  PCP: Myrlene Broker, MD   REFERRING PROVIDER: Myrlene Broker, MD   END OF SESSION:   PT End of Session - 10/12/21 1049     Visit Number 7    Number of Visits 8    Date for PT Re-Evaluation 10/29/21    Authorization Type [employer] MEDICARE    Authorization Time Period KX by 15th    Progress Note Due on Visit 10    PT Start Time 1050    PT Stop Time 1130    PT Time Calculation (min) 40 min    Activity Tolerance Patient tolerated treatment well    Behavior During Therapy WFL for tasks assessed/performed                 Past Medical History:  Diagnosis Date   Allergy    Arthritis    Asthma    Cataract    Colon polyp    patient states she had 1 removed 20 years ago   Essential hypertension 06/21/2013   FHx: migraine headaches    Hx of colonic polyps    Hyperlipidemia    Hypertension    Sarcoidosis    Thyroid disease    Past Surgical History:  Procedure Laterality Date   COLONOSCOPY WITH PROPOFOL N/A 12/17/2020   Procedure: COLONOSCOPY WITH PROPOFOL;  Surgeon: Napoleon Form, MD;  Location: [facility];  Service: Endoscopy;  Laterality: N/A;   ENDOSCOPIC MUCOSAL RESECTION N/A 12/17/2020   Procedure: ENDOSCOPIC MUCOSAL RESECTION;  Surgeon: Napoleon Form, MD;  Location: [facility];  Service: Endoscopy;  Laterality: N/A;   LEFT HEART CATHETERIZATION WITH CORONARY ANGIOGRAM N/A 06/24/2013   Procedure: LEFT HEART CATHETERIZATION WITH CORONARY ANGIOGRAM;  Surgeon: Micheline Chapman, MD;  Location: [facility] CATH LAB;  Service: Cardiovascular;  Laterality: N/A;   POLYPECTOMY  12/17/2020   Procedure: POLYPECTOMY;  Surgeon: Napoleon Form, MD;  Location: [facility];  Service: Endoscopy;;   SUBMUCOSAL LIFTING INJECTION  12/17/2020   Procedure: SUBMUCOSAL LIFTING INJECTION;  Surgeon: Napoleon Form, MD;   Location: [facility];  Service: Endoscopy;;   TEMPOROMANDIBULAR JOINT SURGERY     Patient Active Problem List   Diagnosis Date Noted   Left leg pain 08/20/2021   Bilateral hearing loss 08/20/2021   Memory loss 04/27/2021   Hypothyroidism 04/27/2021   Aortic atherosclerosis (HCC) 04/27/2021   Urinary frequency 04/27/2021   Adenomatous polyp of transverse colon    Polyp of ascending colon    Atypical chest pain 12/20/2018   DOE (dyspnea on exertion) 11/07/2018   Dyslipidemia 06/21/2013   Family history of coronary artery disease 06/21/2013    REFERRING DIAG: Left leg pain   THERAPY DIAG:  Pain in left leg  Muscle weakness (generalized)  Other abnormalities of gait and mobility  Rationale for Evaluation and Treatment Rehabilitation  PERTINENT HISTORY: 05/2020 left hamstring tear, previous right ankle fracture 2 years ago, history of left hip bursitis, chronic low back pain   PRECAUTIONS: None    SUBJECTIVE: Patient reports she did not do her exercises for the past week because her niece was in town. She did do some walking and standing, but no exercises. She denies any pain today.  PAIN:  Are you having pain? Yes:  NPRS scale: 0/10 Pain location: Left leg (hamstring) Pain description: Burning Aggravating factors: Constant, walking, stairs, squat Relieving factors: Medication, rest  OBJECTIVE: (objective measures completed at initial evaluation unless otherwise dated) PATIENT SURVEYS:  FOTO 57% functional status  10/05/2021: 66%   MUSCLE LENGTH: Left hamstring length deficit   PALPATION: Tender throughout left proximal-mid hamstring, left hip greater trochanteric region   LOWER EXTREMITY MMT:   MMT Right eval Left eval Rt / Lt 09/29/2021 Rt / Lt 10/12/2021  Hip flexion 4 4-    Hip extension 4- 3- 4- / 3+ 4- / 3+  Hip abduction 4- 3+ 4- / 4- 4- / 4-  Knee flexion 5 4- 5 / 4   Knee extension 5 4    Ankle dorsiflexion 5 5    Ankle plantarflexion 4 4       FUNCTIONAL TESTS:  Squat: patient demonstrates decreased depth with weight shift toward right   GAIT: Assistive device utilized: None Level of assistance: Complete Independence Comments: Antalgic on left, coxalgic gait on left     TODAY'S TREATMENT: [facility] Adult PT Treatment:                                                DATE: 10/12/21 Therapeutic Exercise: Nustep L6 x 5 min with LE while taking subjective Figure-4 bridge 2 x 10 each 90-90 table top hold 2 x 5 with 10 sec hold Prone straight leg hip extension with 3# and leg off edge of mat table 2 x 10 each Prone hamstring curl with 3# 2 x 10 each Forward step-up 8" 2 x 10 each Lateral step-up and over 8" 2 x 10   [facility] Adult PT Treatment:                                                DATE: 10/05/21 Therapeutic Exercise: Nustep L6 x 6 min with LE while taking subjective Figure-4 bridge 2 x 10 each Prone straight leg hip extension x 10 each, with 3# x 10 each Prone hamstring curl with 3# 2 x 10 each Sit to stand holding 10# KB 2 x 10 Hip abduction machine 12.5# 2 x 10 each SLS 2 x 30 sec each Deadlift with 25# x 10  [facility] Adult PT Treatment:                                                DATE: 09/29/21 Therapeutic Exercise: Nustep L6 x 5 min with UE/LE while taking subjective Bridge x 10 Sidelying hip abduction 2 x 10 each Prone hip extension 2 x 10 each Figure-4 bridge x 10 each Sit to stand holding 10# KB 2 x 10 Forward step-up 6" box 2 x 10 each (left only) Lateral step-up 6" box 2 x 10 each (left only) SLS 3 x 30 sec each   PATIENT EDUCATION:  Education details: HEP Person educated: Patient Education method: Consulting civil engineer, Demonstration, Tactile cues, Verbal cues Education comprehension: verbalized understanding, returned demonstration, verbal cues required, tactile cues required, and needs further education   HOME EXERCISE PROGRAM: Access Code: [email or web address]     ASSESSMENT: CLINICAL IMPRESSION: Patient tolerated  therapy well with no adverse effects. Therapy continued to focus on progressing strength for  the left hip and leg with good tolerance. She is tolerating progressions in her exercises with increased weight and challenge, she did report feeling left hip discomfort/muscles working with forward step up on left that resolved at completion of exercise. No changes to HEP this visit. She would benefit from continued skilled PT to progress her strength and mobility in order to improve her walking tolerance, ability to perform squat and lifting tasks, stair negotiation, and return to prior active lifestyle.    OBJECTIVE IMPAIRMENTS Abnormal gait, decreased activity tolerance, difficulty walking, decreased strength, impaired flexibility, improper body mechanics, and pain.    ACTIVITY LIMITATIONS lifting, bending, standing, squatting, stairs, and locomotion level   PARTICIPATION LIMITATIONS: cleaning, shopping, community activity, and yard work   PERSONAL FACTORS Past/current experiences and Time since onset of injury/illness/exacerbation are also affecting patient's functional outcome.      GOALS: Goals reviewed with patient? Yes   SHORT TERM GOALS: Target date: 10/01/2021    Patient will be I with initial HEP in order to progress with therapy. Baseline: HEP provided at eval 09/29/2021: independent with initial HEP Goal status: MET   2.  PT will review FOTO with patient by 3rd visit in order to understand expected progress and outcome with therapy. Baseline: FOTO assessed at eval Goal status: MET   3.  Patient will be able to negotiate 1 flight of stairs reciprocally using railing as needed to improve household mobility Baseline: patient uses step-to pattern for stairs 10/05/2021: patient reports depends on day/time  Goal status: PARTIALLY MET   LONG TERM GOALS: Target date: 10/29/2021    Patient will be I with final HEP to maintain progress from PT. Baseline: HEP provided at eval Goal status:  INITIAL   2.  Patient will report >/= 68% status on FOTO to indicate improved functional ability. Baseline: 57% functional status 10/05/2021: 66% Goal status: PARTIALLY MET   3.  Patient will demonstrate improved strength of left knee >/= 4+/5 MMT and hip >/= 4/5 MMT in order to improve walking tolerance for community access Baseline: patient demonstrates left knee and hip strength deficits Goal status: INITIAL   4.  Patient will report </= 1/10 pain well all activities in order to reduce functional limitations and return to prior activity level Baseline: patient reports 1/10 pain at rest that increases with activity Goal status: INITIAL     PLAN: PT FREQUENCY: 1x/week   PT DURATION: 8 weeks   PLANNED INTERVENTIONS: Therapeutic exercises, Therapeutic activity, Neuromuscular re-education, Balance training, Gait training, Patient/Family education, Joint manipulation, Joint mobilization, Stair training, Aquatic Therapy, Dry Needling, Spinal manipulation, Spinal mobilization, Cryotherapy, Moist heat, Taping, Manual therapy, and Re-evaluation   PLAN FOR NEXT SESSION: Review HEP and progress PRN, hip/core/left quad and hamstring strengthening, left hamstring flexibility, squat and hip hinge mechanics, stair training     Hilda Blades, PT, DPT, LAT, ATC 10/12/21  11:32 AM Phone: [phone] Fax: [phone]

## 2021-10-12 ENCOUNTER — Other Ambulatory Visit: Payer: Self-pay

## 2021-10-12 ENCOUNTER — Encounter: Payer: Self-pay | Admitting: Physical Therapy

## 2021-10-12 ENCOUNTER — Ambulatory Visit: Payer: Medicare Other | Admitting: Physical Therapy

## 2021-10-12 DIAGNOSIS — M79605 Pain in left leg: Secondary | ICD-10-CM

## 2021-10-12 DIAGNOSIS — R2689 Other abnormalities of gait and mobility: Secondary | ICD-10-CM

## 2021-10-12 DIAGNOSIS — M6281 Muscle weakness (generalized): Secondary | ICD-10-CM

## 2021-10-18 NOTE — Therapy (Signed)
OUTPATIENT PHYSICAL THERAPY TREATMENT NOTE   Patient Name: Patricia Chambers MRN: [id number] DOB:02/19/1951, 71 y.o., female, female Today's Date: 10/19/2021  PCP: Crawford, Elizabeth A, MD   REFERRING PROVIDER: Crawford, Elizabeth A, MD   END OF SESSION:   PT End of Session - 10/19/21 1011     Visit Number 8    Number of Visits 15    Date for PT Re-Evaluation 11/30/21    Authorization Type [employer] MEDICARE    Authorization Time Period KX by 15th    Progress Note Due on Visit 10    PT Start Time 1005    PT Stop Time 1045    PT Time Calculation (min) 40 min    Activity Tolerance Patient tolerated treatment well    Behavior During Therapy WFL for tasks assessed/performed                  Past Medical History:  Diagnosis Date   Allergy    Arthritis    Asthma    Cataract    Colon polyp    patient states she had 1 removed 20 years ago   Essential hypertension 06/21/2013   FHx: migraine headaches    Hx of colonic polyps    Hyperlipidemia    Hypertension    Sarcoidosis    Thyroid disease    Past Surgical History:  Procedure Laterality Date   COLONOSCOPY WITH PROPOFOL N/A 12/17/2020   Procedure: COLONOSCOPY WITH PROPOFOL;  Surgeon: Nandigam, Kavitha V, MD;  Location: [facility];  Service: Endoscopy;  Laterality: N/A;   ENDOSCOPIC MUCOSAL RESECTION N/A 12/17/2020   Procedure: ENDOSCOPIC MUCOSAL RESECTION;  Surgeon: Nandigam, Kavitha V, MD;  Location: [facility];  Service: Endoscopy;  Laterality: N/A;   LEFT HEART CATHETERIZATION WITH CORONARY ANGIOGRAM N/A 06/24/2013   Procedure: LEFT HEART CATHETERIZATION WITH CORONARY ANGIOGRAM;  Surgeon: Michael D Cooper, MD;  Location: [facility];  Service: Cardiovascular;  Laterality: N/A;   POLYPECTOMY  12/17/2020   Procedure: POLYPECTOMY;  Surgeon: Nandigam, Kavitha V, MD;  Location: [facility];  Service: Endoscopy;;   SUBMUCOSAL LIFTING INJECTION  12/17/2020   Procedure: SUBMUCOSAL LIFTING INJECTION;  Surgeon: Nandigam, Kavitha V, MD;   Location: [facility];  Service: Endoscopy;;   TEMPOROMANDIBULAR JOINT SURGERY     Patient Active Problem List   Diagnosis Date Noted   Left leg pain 08/20/2021   Bilateral hearing loss 08/20/2021   Memory loss 04/27/2021   Hypothyroidism 04/27/2021   Aortic atherosclerosis (HCC) 04/27/2021   Urinary frequency 04/27/2021   Adenomatous polyp of transverse colon    Polyp of ascending colon    Atypical chest pain 12/20/2018   DOE (dyspnea on exertion) 11/07/2018   Dyslipidemia 06/21/2013   Family history of coronary artery disease 06/21/2013    REFERRING DIAG: Left leg pain   THERAPY DIAG:  Pain in left leg  Muscle weakness (generalized)  Other abnormalities of gait and mobility  Rationale for Evaluation and Treatment Rehabilitation  PERTINENT HISTORY: 05/2020 left hamstring tear, previous right ankle fracture 2 years ago, history of left hip bursitis, chronic low back pain   PRECAUTIONS: None    SUBJECTIVE: Patient reports she had about 3 days where she did not have any of the burning sensation in her leg thigh which is the first time in about a year that has happened. She has been doing yard work much easier and she is walking 40 minutes.   PAIN:  Are you having pain? Yes:  NPRS scale: 0/10 Pain location: Left leg (  hamstring) Pain description: Burning Aggravating factors: Constant, walking, stairs, squat Relieving factors: Medication, rest    OBJECTIVE: (objective measures completed at initial evaluation unless otherwise dated) PATIENT SURVEYS:  FOTO 57% functional status  10/05/2021: 66%   MUSCLE LENGTH: Left hamstring length deficit   PALPATION: Tender throughout left proximal-mid hamstring, left hip greater trochanteric region   LOWER EXTREMITY MMT:   MMT Right eval Left eval Rt / Lt 09/29/2021 Rt / Lt 10/12/2021 Rt / Lt 10/19/2021  Hip flexion 4 4-   4+ / 4  Hip extension 4- 3- 4- / 3+ 4- / 3+ 4 / 4-  Hip abduction 4- 3+ 4- / 4- 4- / 4- 4 / 4-  Knee  flexion 5 4- 5 / 4  5 / 4  Knee extension 5 4   5 / 5  Ankle dorsiflexion 5 5     Ankle plantarflexion 4 4       FUNCTIONAL TESTS:  Squat: patient demonstrates decreased depth   GAIT: Assistive device utilized: None Level of assistance: Complete Independence Comments: Slight coxalgic gait on left     TODAY'S TREATMENT: [facility] Adult PT Treatment:                                                DATE: 10/18/21 Therapeutic Exercise: Nustep L6 x 5 min with LE while taking subjective Piriformis stretch 2 x 20 sec each SLR with 3# 2 x 10 each Sidelying hip abduction with 3# 2 x 10 each Prone straight leg hip extension with 3# 2 x 10 each Prone hamstring curl with 3# 2 x 10 each Deadlift 30# from 4" box 3 x 10 SLS 3 x 30 sec each Forward step-up 8" 2 x 10 each Lateral step-up and over 8" 2 x 10   [facility] Adult PT Treatment:                                                DATE: 10/12/21 Therapeutic Exercise: Nustep L6 x 5 min with LE while taking subjective Figure-4 bridge 2 x 10 each 90-90 table top hold 2 x 5 with 10 sec hold Prone straight leg hip extension with 3# and leg off edge of mat table 2 x 10 each Prone hamstring curl with 3# 2 x 10 each Forward step-up 8" 2 x 10 each Lateral step-up and over 8" 2 x 10  [facility] Adult PT Treatment:                                                DATE: 10/05/21 Therapeutic Exercise: Nustep L6 x 6 min with LE while taking subjective Figure-4 bridge 2 x 10 each Prone straight leg hip extension x 10 each, with 3# x 10 each Prone hamstring curl with 3# 2 x 10 each Sit to stand holding 10# KB 2 x 10 Hip abduction machine 12.5# 2 x 10 each SLS 2 x 30 sec each Deadlift with 25# x 10  [facility] Adult PT Treatment:                                                  DATE: 09/29/21 Therapeutic Exercise: Nustep L6 x 5 min with UE/LE while taking subjective Bridge x 10 Sidelying hip abduction 2 x 10 each Prone hip extension 2 x 10 each Figure-4 bridge x 10  each Sit to stand holding 10# KB 2 x 10 Forward step-up 6" box 2 x 10 each (left only) Lateral step-up 6" box 2 x 10 each (left only) SLS 3 x 30 sec each   PATIENT EDUCATION:  Education details: POC extension, HEP Person educated: Patient Education method: Explanation, Demonstration, Tactile cues, Verbal cues Education comprehension: verbalized understanding, returned demonstration, verbal cues required, tactile cues required, and needs further education   HOME EXERCISE PROGRAM: Access Code: [email or web address]     ASSESSMENT: CLINICAL IMPRESSION: Patient tolerated therapy well with no adverse effects. She has made great progress in therapy, demonstrating improved strength and reporting improved functional ability. Therapy continues to focus primarily on strengthening and she is progressing well with increased resistance and incorporating lifting with good tolerance. Overall she does continue to exhibit slight strength deficits and limitations in functional ability so will extend PT POC for 6 more weeks at frequency of 1x/week. She would benefit from continued skilled PT to progress her strength and mobility in order to improve her walking tolerance, ability to perform squat and lifting tasks, stair negotiation, and return to prior active lifestyle.    OBJECTIVE IMPAIRMENTS Abnormal gait, decreased activity tolerance, difficulty walking, decreased strength, impaired flexibility, improper body mechanics, and pain.    ACTIVITY LIMITATIONS lifting, bending, standing, squatting, stairs, and locomotion level   PARTICIPATION LIMITATIONS: cleaning, shopping, community activity, and yard work   PERSONAL FACTORS Past/current experiences and Time since onset of injury/illness/exacerbation are also affecting patient's functional outcome.      GOALS: Goals reviewed with patient? Yes   SHORT TERM GOALS: Target date: 10/01/2021    Patient will be I with initial HEP in order to progress with  therapy. Baseline: HEP provided at eval 09/29/2021: independent with initial HEP Goal status: MET   2.  PT will review FOTO with patient by 3rd visit in order to understand expected progress and outcome with therapy. Baseline: FOTO assessed at eval Goal status: MET   3.  Patient will be able to negotiate 1 flight of stairs reciprocally using railing as needed to improve household mobility Baseline: patient uses step-to pattern for stairs 10/05/2021: patient reports depends on day/time  10/19/2021: patient reports able to negotiate stairs reciprocally Goal status: MET   LONG TERM GOALS: Target date: 11/30/2021   Patient will be I with final HEP to maintain progress from PT. Baseline: HEP provided at eval 10/19/2021: progressing with final HEP Goal status: PARTIALLY MET   2.  Patient will report >/= 68% status on FOTO to indicate improved functional ability. Baseline: 57% functional status 10/05/2021: 66% Goal status: PARTIALLY MET   3.  Patient will demonstrate improved strength of left knee >/= 4+/5 MMT and hip >/= 4/5 MMT in order to improve walking tolerance for community access Baseline: patient demonstrates left knee and hip strength deficits 10/19/2021: patient continues to exhibit strength deficits (see above) Goal status: PARTIALLY MET   4.  Patient will report </= 1/10 pain well all activities in order to reduce functional limitations and return to prior activity level Baseline: patient reports 1/10 pain at rest that increases with activity 10/19/2021: patient continues to report occasional pain with activity Goal status: PARTIALLY MET     PLAN: PT FREQUENCY: 1x/week   PT DURATION: 6 weeks  PLANNED INTERVENTIONS: Therapeutic exercises, Therapeutic activity, Neuromuscular re-education, Balance training, Gait training, Patient/Family education, Joint manipulation, Joint mobilization, Stair training, Aquatic Therapy, Dry Needling, Spinal manipulation, Spinal mobilization,  Cryotherapy, Moist heat, Taping, Manual therapy, and Re-evaluation   PLAN FOR NEXT SESSION: Review HEP and progress PRN, hip/core/left quad and hamstring strengthening, left hamstring flexibility, squat and hip hinge mechanics, stair training     Campbell Brown, PT, DPT, LAT, ATC 10/19/21  11:49 AM Phone: [phone] Fax: [phone]       

## 2021-10-19 ENCOUNTER — Ambulatory Visit: Payer: Medicare Other | Admitting: Physical Therapy

## 2021-10-19 ENCOUNTER — Encounter: Payer: Self-pay | Admitting: Physical Therapy

## 2021-10-19 ENCOUNTER — Other Ambulatory Visit: Payer: Self-pay

## 2021-10-19 DIAGNOSIS — M6281 Muscle weakness (generalized): Secondary | ICD-10-CM

## 2021-10-19 DIAGNOSIS — M79605 Pain in left leg: Secondary | ICD-10-CM

## 2021-10-19 DIAGNOSIS — R2689 Other abnormalities of gait and mobility: Secondary | ICD-10-CM

## 2021-10-25 NOTE — Therapy (Signed)
OUTPATIENT PHYSICAL THERAPY TREATMENT NOTE   Patient Name: Patricia Chambers MRN: [id number] DOB:[birth year], 71 y.o., female Today's Date: 10/26/2021  PCP: Hoyt Koch, MD   REFERRING PROVIDER: Hoyt Koch, MD   END OF SESSION:   PT End of Session - 10/26/21 1004     Visit Number 9    Number of Visits 15    Date for PT Re-Evaluation 11/30/21    Authorization Type [employer] MEDICARE    Authorization Time Period KX by 15th    Progress Note Due on Visit 10    PT Start Time 1001    PT Stop Time 1045    PT Time Calculation (min) 44 min    Activity Tolerance Patient tolerated treatment well    Behavior During Therapy WFL for tasks assessed/performed                   Past Medical History:  Diagnosis Date   Allergy    Arthritis    Asthma    Cataract    Colon polyp    patient states she had 1 removed 20 years ago   Essential hypertension 06/21/2013   FHx: migraine headaches    Hx of colonic polyps    Hyperlipidemia    Hypertension    Sarcoidosis    Thyroid disease    Past Surgical History:  Procedure Laterality Date   COLONOSCOPY WITH PROPOFOL N/A 12/17/2020   Procedure: COLONOSCOPY WITH PROPOFOL;  Surgeon: Mauri Pole, MD;  Location: [facility];  Service: Endoscopy;  Laterality: N/A;   ENDOSCOPIC MUCOSAL RESECTION N/A 12/17/2020   Procedure: ENDOSCOPIC MUCOSAL RESECTION;  Surgeon: Mauri Pole, MD;  Location: [facility];  Service: Endoscopy;  Laterality: N/A;   LEFT HEART CATHETERIZATION WITH CORONARY ANGIOGRAM N/A 06/24/2013   Procedure: LEFT HEART CATHETERIZATION WITH CORONARY ANGIOGRAM;  Surgeon: Blane Ohara, MD;  Location: [facility] CATH LAB;  Service: Cardiovascular;  Laterality: N/A;   POLYPECTOMY  12/17/2020   Procedure: POLYPECTOMY;  Surgeon: Mauri Pole, MD;  Location: [facility];  Service: Endoscopy;;   SUBMUCOSAL LIFTING INJECTION  12/17/2020   Procedure: SUBMUCOSAL LIFTING INJECTION;  Surgeon: Mauri Pole, MD;   Location: [facility];  Service: Endoscopy;;   TEMPOROMANDIBULAR JOINT SURGERY     Patient Active Problem List   Diagnosis Date Noted   Left leg pain 08/20/2021   Bilateral hearing loss 08/20/2021   Memory loss 04/27/2021   Hypothyroidism 04/27/2021   Aortic atherosclerosis ([place]) 04/27/2021   Urinary frequency 04/27/2021   Adenomatous polyp of transverse colon    Polyp of ascending colon    Atypical chest pain 12/20/2018   DOE (dyspnea on exertion) 11/07/2018   Dyslipidemia 06/21/2013   Family history of coronary artery disease 06/21/2013    REFERRING DIAG: Left leg pain   THERAPY DIAG:  Pain in left leg  Muscle weakness (generalized)  Other abnormalities of gait and mobility  Rationale for Evaluation and Treatment Rehabilitation  PERTINENT HISTORY: 05/2020 left hamstring tear, previous right ankle fracture 2 years ago, history of left hip bursitis, chronic low back pain   PRECAUTIONS: None    SUBJECTIVE: Patient reports her walking is going well when she can do it. She feels her walking form is better and she has been focusing on walking up stairs reciprocally. She also notes she does not have to use her arms to lift her leg up to get into the car.  PAIN:  Are you having pain? Yes:  NPRS scale: 0/10 Pain  location: Left leg (hamstring) Pain description: Burning Aggravating factors: Constant, walking, stairs, squat Relieving factors: Medication, rest    OBJECTIVE: (objective measures completed at initial evaluation unless otherwise dated) PATIENT SURVEYS:  FOTO 57% functional status  10/05/2021: 66%   MUSCLE LENGTH: Left hamstring length deficit   PALPATION: Tender throughout left proximal-mid hamstring, left hip greater trochanteric region   LOWER EXTREMITY MMT:   MMT Right eval Left eval Rt / Lt 09/29/2021 Rt / Lt 10/12/2021 Rt / Lt 10/19/2021 Rt / Lt 10/26/2021  Hip flexion 4 4-   4+ / 4   Hip extension 4- 3- 4- / 3+ 4- / 3+ 4 / 4-   Hip abduction 4- 3+  4- / 4- 4- / 4- 4 / 4- 4 / 4-  Knee flexion 5 4- 5 / 4  5 / 4   Knee extension $RemoveBefore'5 4   5 '[id number]$ / 5   Ankle dorsiflexion 5 5      Ankle plantarflexion 4 4        FUNCTIONAL TESTS:  Squat: patient demonstrates decreased depth   GAIT: Assistive device utilized: None Level of assistance: Complete Independence Comments: Slight coxalgic gait on left     TODAY'S TREATMENT: [facility] Adult PT Treatment:                                                DATE: 10/26/21 Therapeutic Exercise: Nustep L6 x 5 min with LE while taking subjective Piriformis stretch 2 x 20 sec each SLR with 4# 2 x 10 each Prone straight leg hip extension with 4# 2 x 10 each Hamstring curl machine 25# 3 x 10 Standing hip abduction machine 25# 2 x 10 each Deadlift 30# from 4" box 3 x 10 Forward step-up 8" box 2 x 10 SLS 3 x 30 sec each   [facility] Adult PT Treatment:                                                DATE: 10/18/21 Therapeutic Exercise: Nustep L6 x 5 min with LE while taking subjective Piriformis stretch 2 x 20 sec each SLR with 3# 2 x 10 each Sidelying hip abduction with 3# 2 x 10 each Prone straight leg hip extension with 3# 2 x 10 each Prone hamstring curl with 3# 2 x 10 each Deadlift 30# from 4" box 3 x 10 SLS 3 x 30 sec each Forward step-up 8" 2 x 10 each Lateral step-up and over 8" 2 x 10  [facility] Adult PT Treatment:                                                DATE: 10/12/21 Therapeutic Exercise: Nustep L6 x 5 min with LE while taking subjective Figure-4 bridge 2 x 10 each 90-90 table top hold 2 x 5 with 10 sec hold Prone straight leg hip extension with 3# and leg off edge of mat table 2 x 10 each Prone hamstring curl with 3# 2 x 10 each Forward step-up 8" 2 x 10 each Lateral step-up and over 8" 2 x  10   PATIENT EDUCATION:  Education details: HEP Person educated: Patient Education method: Education officer, environmental, Corporate treasurer cues, Verbal cues Education comprehension: verbalized understanding, returned  demonstration, verbal cues required, tactile cues required, and needs further education   HOME EXERCISE PROGRAM: Access Code: [email or web address]     ASSESSMENT: CLINICAL IMPRESSION: Patient tolerated therapy well with no adverse effects. Therapy focused primarily on continued progression of hip and LE strengthening with good tolerance. She notes improved ability to negotiate stairs and feels her walking is better. She continues  to demonstrate hip strength deficit on the left. No changes to HEP this visit. She would benefit from continued skilled PT to progress her strength and mobility in order to improve her walking tolerance, ability to perform squat and lifting tasks, stair negotiation, and return to prior active lifestyle.    OBJECTIVE IMPAIRMENTS Abnormal gait, decreased activity tolerance, difficulty walking, decreased strength, impaired flexibility, improper body mechanics, and pain.    ACTIVITY LIMITATIONS lifting, bending, standing, squatting, stairs, and locomotion level   PARTICIPATION LIMITATIONS: cleaning, shopping, community activity, and yard work   PERSONAL FACTORS Past/current experiences and Time since onset of injury/illness/exacerbation are also affecting patient's functional outcome.      GOALS: Goals reviewed with patient? Yes   SHORT TERM GOALS: Target date: 10/01/2021    Patient will be I with initial HEP in order to progress with therapy. Baseline: HEP provided at eval 09/29/2021: independent with initial HEP Goal status: MET   2.  PT will review FOTO with patient by 3rd visit in order to understand expected progress and outcome with therapy. Baseline: FOTO assessed at eval Goal status: MET   3.  Patient will be able to negotiate 1 flight of stairs reciprocally using railing as needed to improve household mobility Baseline: patient uses step-to pattern for stairs 10/05/2021: patient reports depends on day/time  10/19/2021: patient reports able to negotiate stairs  reciprocally Goal status: MET   LONG TERM GOALS: Target date: 11/30/2021   Patient will be I with final HEP to maintain progress from PT. Baseline: HEP provided at eval 10/19/2021: progressing with final HEP Goal status: PARTIALLY MET   2.  Patient will report >/= 68% status on FOTO to indicate improved functional ability. Baseline: 57% functional status 10/05/2021: 66% Goal status: PARTIALLY MET   3.  Patient will demonstrate improved strength of left knee >/= 4+/5 MMT and hip >/= 4/5 MMT in order to improve walking tolerance for community access Baseline: patient demonstrates left knee and hip strength deficits 10/19/2021: patient continues to exhibit strength deficits (see above) Goal status: PARTIALLY MET   4.  Patient will report </= 1/10 pain well all activities in order to reduce functional limitations and return to prior activity level Baseline: patient reports 1/10 pain at rest that increases with activity 10/19/2021: patient continues to report occasional pain with activity Goal status: PARTIALLY MET     PLAN: PT FREQUENCY: 1x/week   PT DURATION: 6 weeks   PLANNED INTERVENTIONS: Therapeutic exercises, Therapeutic activity, Neuromuscular re-education, Balance training, Gait training, Patient/Family education, Joint manipulation, Joint mobilization, Stair training, Aquatic Therapy, Dry Needling, Spinal manipulation, Spinal mobilization, Cryotherapy, Moist heat, Taping, Manual therapy, and Re-evaluation   PLAN FOR NEXT SESSION: Review HEP and progress PRN, hip/core/left quad and hamstring strengthening, left hamstring flexibility, squat and hip hinge mechanics, stair training     Hilda Blades, PT, DPT, LAT, ATC 10/26/21  10:51 AM Phone: [phone] Fax: [phone]

## 2021-10-26 ENCOUNTER — Ambulatory Visit: Payer: Medicare Other | Attending: Audiologist | Admitting: Physical Therapy

## 2021-10-26 ENCOUNTER — Encounter: Payer: Self-pay | Admitting: Physical Therapy

## 2021-10-26 ENCOUNTER — Other Ambulatory Visit: Payer: Self-pay

## 2021-10-26 DIAGNOSIS — R2689 Other abnormalities of gait and mobility: Secondary | ICD-10-CM

## 2021-10-26 DIAGNOSIS — M79605 Pain in left leg: Secondary | ICD-10-CM

## 2021-10-26 DIAGNOSIS — M6281 Muscle weakness (generalized): Secondary | ICD-10-CM

## 2021-11-01 NOTE — Therapy (Signed)
OUTPATIENT PHYSICAL THERAPY TREATMENT NOTE   Patient Name: Patricia Chambers MRN: [id number] DOB:[birth year], 71 y.o., female Today's Date: 11/02/2021  PCP: Hoyt Koch, MD   REFERRING PROVIDER: Hoyt Koch, MD   END OF SESSION:   PT End of Session - 11/02/21 1012     Visit Number 10    Number of Visits 15    Date for PT Re-Evaluation 11/30/21    Authorization Type [employer] MEDICARE    Authorization Time Period KX by 15th    Progress Note Due on Visit 20    PT Start Time 1002    PT Stop Time 1045    PT Time Calculation (min) 43 min    Activity Tolerance Patient tolerated treatment well    Behavior During Therapy WFL for tasks assessed/performed                    Past Medical History:  Diagnosis Date   Allergy    Arthritis    Asthma    Cataract    Colon polyp    patient states she had 1 removed 20 years ago   Essential hypertension 06/21/2013   FHx: migraine headaches    Hx of colonic polyps    Hyperlipidemia    Hypertension    Sarcoidosis    Thyroid disease    Past Surgical History:  Procedure Laterality Date   COLONOSCOPY WITH PROPOFOL N/A 12/17/2020   Procedure: COLONOSCOPY WITH PROPOFOL;  Surgeon: Mauri Pole, MD;  Location: [facility];  Service: Endoscopy;  Laterality: N/A;   ENDOSCOPIC MUCOSAL RESECTION N/A 12/17/2020   Procedure: ENDOSCOPIC MUCOSAL RESECTION;  Surgeon: Mauri Pole, MD;  Location: [facility];  Service: Endoscopy;  Laterality: N/A;   LEFT HEART CATHETERIZATION WITH CORONARY ANGIOGRAM N/A 06/24/2013   Procedure: LEFT HEART CATHETERIZATION WITH CORONARY ANGIOGRAM;  Surgeon: Blane Ohara, MD;  Location: [facility] CATH LAB;  Service: Cardiovascular;  Laterality: N/A;   POLYPECTOMY  12/17/2020   Procedure: POLYPECTOMY;  Surgeon: Mauri Pole, MD;  Location: [facility];  Service: Endoscopy;;   SUBMUCOSAL LIFTING INJECTION  12/17/2020   Procedure: SUBMUCOSAL LIFTING INJECTION;  Surgeon: Mauri Pole,  MD;  Location: [facility];  Service: Endoscopy;;   TEMPOROMANDIBULAR JOINT SURGERY     Patient Active Problem List   Diagnosis Date Noted   Left leg pain 08/20/2021   Bilateral hearing loss 08/20/2021   Memory loss 04/27/2021   Hypothyroidism 04/27/2021   Aortic atherosclerosis ([place]) 04/27/2021   Urinary frequency 04/27/2021   Adenomatous polyp of transverse colon    Polyp of ascending colon    Atypical chest pain 12/20/2018   DOE (dyspnea on exertion) 11/07/2018   Dyslipidemia 06/21/2013   Family history of coronary artery disease 06/21/2013    REFERRING DIAG: Left leg pain   THERAPY DIAG:  Pain in left leg  Muscle weakness (generalized)  Other abnormalities of gait and mobility  Rationale for Evaluation and Treatment Rehabilitation  PERTINENT HISTORY: 05/2020 left hamstring tear, previous right ankle fracture 2 years ago, history of left hip bursitis, chronic low back pain   PRECAUTIONS: None    SUBJECTIVE: Patient reports she feels like she is dragging this morning, she woke up early and she feels like that may be contributing to it. She states she had a headache so was not able to do as much walking, but she has been doing a lot of yard work.  PAIN:  Are you having pain? Yes:  NPRS scale: 0/10 Pain  location: Left leg (hamstring) Pain description: Burning Aggravating factors: Constant, walking, stairs, squat Relieving factors: Medication, rest    OBJECTIVE: (objective measures completed at initial evaluation unless otherwise dated) PATIENT SURVEYS:  FOTO 57% functional status  10/05/2021: 66%   MUSCLE LENGTH: Left hamstring length deficit   PALPATION: Tender throughout left proximal-mid hamstring, left hip greater trochanteric region   LOWER EXTREMITY MMT:   MMT Right eval Left eval Rt / Lt 09/29/2021 Rt / Lt 10/12/2021 Rt / Lt 10/19/2021 Rt / Lt 10/26/2021 Rt / Lt  Hip flexion 4 4-   4+ / 4    Hip extension 4- 3- 4- / 3+ 4- / 3+ 4 / 4-    Hip  abduction 4- 3+ 4- / 4- 4- / 4- 4 / 4- 4 / 4-   Knee flexion 5 4- 5 / 4  5 / 4  5 / 4+  Knee extension $RemoveBefore'5 4   5 '[id number]$ / 5    Ankle dorsiflexion 5 5       Ankle plantarflexion 4 4         FUNCTIONAL TESTS:  Squat: patient demonstrates decreased depth   GAIT: Assistive device utilized: None Level of assistance: Complete Independence Comments: Slight coxalgic gait on left     TODAY'S TREATMENT: [facility] Adult PT Treatment:                                                DATE: 11/02/21 Therapeutic Exercise: Nustep L6 x 5 min with LE while taking subjective Piriformis stretch 2 x 20 sec each Sidelying hip abduction 2 x 15 each Bridge with feet elevated on physioball with slight knee bend 2 x 10 Forward step-up 8" box 2 x 10 Hip hike on edge of box 2 x 10 each Kickstand deadlift 25# from 8" box 3 x 8 each   [facility] Adult PT Treatment:                                                DATE: 10/26/21 Therapeutic Exercise: Nustep L6 x 5 min with LE while taking subjective Piriformis stretch 2 x 20 sec each SLR with 4# 2 x 10 each Prone straight leg hip extension with 4# 2 x 10 each Hamstring curl machine 25# 3 x 10 Standing hip abduction machine 25# 2 x 10 each Deadlift 30# from 4" box 3 x 10 Forward step-up 8" box 2 x 10 SLS 3 x 30 sec each  [facility] Adult PT Treatment:                                                DATE: 10/18/21 Therapeutic Exercise: Nustep L6 x 5 min with LE while taking subjective Piriformis stretch 2 x 20 sec each SLR with 3# 2 x 10 each Sidelying hip abduction with 3# 2 x 10 each Prone straight leg hip extension with 3# 2 x 10 each Prone hamstring curl with 3# 2 x 10 each Deadlift 30# from 4" box 3 x 10 SLS 3 x 30 sec each Forward step-up 8" 2 x 10 each  Lateral step-up and over 8" 2 x 10  [facility] Adult PT Treatment:                                                DATE: 10/12/21 Therapeutic Exercise: Nustep L6 x 5 min with LE while taking subjective Figure-4 bridge 2 x 10  each 90-90 table top hold 2 x 5 with 10 sec hold Prone straight leg hip extension with 3# and leg off edge of mat table 2 x 10 each Prone hamstring curl with 3# 2 x 10 each Forward step-up 8" 2 x 10 each Lateral step-up and over 8" 2 x 10   PATIENT EDUCATION:  Education details: HEP update Person educated: Patient Education method: Explanation, Demonstration, Tactile cues, Verbal cues, Handout Education comprehension: verbalized understanding, returned demonstration, verbal cues required, tactile cues required, and needs further education   HOME EXERCISE PROGRAM: Access Code: [email or web address]     ASSESSMENT: CLINICAL IMPRESSION: Patient tolerated therapy well with no adverse effects. Therapy continues to focus primarily on progressing hip, core, and hamstring strengthening and lumbopelvic control. She seems to be progressing well with her exercises and tolerating increased challenge and resistance. She does note feeling the left hamstring working more than the right with exercises but denies pain with therapy. Updated HEP to progress her strengthening for home. She would benefit from continued skilled PT to progress her strength and mobility in order to improve her walking tolerance, ability to perform squat and lifting tasks, stair negotiation, and return to prior active lifestyle.    OBJECTIVE IMPAIRMENTS Abnormal gait, decreased activity tolerance, difficulty walking, decreased strength, impaired flexibility, improper body mechanics, and pain.    ACTIVITY LIMITATIONS lifting, bending, standing, squatting, stairs, and locomotion level   PARTICIPATION LIMITATIONS: cleaning, shopping, community activity, and yard work   PERSONAL FACTORS Past/current experiences and Time since onset of injury/illness/exacerbation are also affecting patient's functional outcome.      GOALS: Goals reviewed with patient? Yes   SHORT TERM GOALS: Target date: 10/01/2021    Patient will be I with initial HEP in  order to progress with therapy. Baseline: HEP provided at eval 09/29/2021: independent with initial HEP Goal status: MET   2.  PT will review FOTO with patient by 3rd visit in order to understand expected progress and outcome with therapy. Baseline: FOTO assessed at eval Goal status: MET   3.  Patient will be able to negotiate 1 flight of stairs reciprocally using railing as needed to improve household mobility Baseline: patient uses step-to pattern for stairs 10/05/2021: patient reports depends on day/time  10/19/2021: patient reports able to negotiate stairs reciprocally Goal status: MET   LONG TERM GOALS: Target date: 11/30/2021   Patient will be I with final HEP to maintain progress from PT. Baseline: HEP provided at eval 10/19/2021: progressing with final HEP Goal status: PARTIALLY MET   2.  Patient will report >/= 68% status on FOTO to indicate improved functional ability. Baseline: 57% functional status 10/05/2021: 66% Goal status: PARTIALLY MET   3.  Patient will demonstrate improved strength of left knee >/= 4+/5 MMT and hip >/= 4/5 MMT in order to improve walking tolerance for community access Baseline: patient demonstrates left knee and hip strength deficits 10/19/2021: patient continues to exhibit strength deficits (see above) Goal status: PARTIALLY MET   4.  Patient will report </= 1/10  pain well all activities in order to reduce functional limitations and return to prior activity level Baseline: patient reports 1/10 pain at rest that increases with activity 10/19/2021: patient continues to report occasional pain with activity Goal status: PARTIALLY MET     PLAN: PT FREQUENCY: 1x/week   PT DURATION: 6 weeks   PLANNED INTERVENTIONS: Therapeutic exercises, Therapeutic activity, Neuromuscular re-education, Balance training, Gait training, Patient/Family education, Joint manipulation, Joint mobilization, Stair training, Aquatic Therapy, Dry Needling, Spinal manipulation,  Spinal mobilization, Cryotherapy, Moist heat, Taping, Manual therapy, and Re-evaluation   PLAN FOR NEXT SESSION: Review HEP and progress PRN, hip/core/left quad and hamstring strengthening, left hamstring flexibility, squat and hip hinge mechanics, stair training     Hilda Blades, PT, DPT, LAT, ATC 11/02/21  10:45 AM Phone: [phone] Fax: [phone]

## 2021-11-02 ENCOUNTER — Ambulatory Visit: Payer: Medicare Other | Admitting: Physical Therapy

## 2021-11-02 ENCOUNTER — Other Ambulatory Visit: Payer: Self-pay

## 2021-11-02 ENCOUNTER — Encounter: Payer: Self-pay | Admitting: Physical Therapy

## 2021-11-02 DIAGNOSIS — M79605 Pain in left leg: Secondary | ICD-10-CM | POA: Diagnosis not present

## 2021-11-02 DIAGNOSIS — M6281 Muscle weakness (generalized): Secondary | ICD-10-CM

## 2021-11-02 DIAGNOSIS — R2689 Other abnormalities of gait and mobility: Secondary | ICD-10-CM

## 2021-11-08 NOTE — Therapy (Signed)
OUTPATIENT PHYSICAL THERAPY TREATMENT NOTE   Patient Name: Patricia Chambers MRN: [id number] DOB:[birth year], 71 y.o., female Today's Date: 11/09/2021  PCP: Hoyt Koch, MD   REFERRING PROVIDER: Hoyt Koch, MD   END OF SESSION:   PT End of Session - 11/09/21 1406     Visit Number 11    Number of Visits 15    Date for PT Re-Evaluation 11/30/21    Authorization Type [employer] MEDICARE    Authorization Time Period KX by 15th    Progress Note Due on Visit 20    PT Start Time 1405    PT Stop Time 1445    PT Time Calculation (min) 40 min    Activity Tolerance Patient tolerated treatment well    Behavior During Therapy WFL for tasks assessed/performed                     Past Medical History:  Diagnosis Date   Allergy    Arthritis    Asthma    Cataract    Colon polyp    patient states she had 1 removed 20 years ago   Essential hypertension 06/21/2013   FHx: migraine headaches    Hx of colonic polyps    Hyperlipidemia    Hypertension    Sarcoidosis    Thyroid disease    Past Surgical History:  Procedure Laterality Date   COLONOSCOPY WITH PROPOFOL N/A 12/17/2020   Procedure: COLONOSCOPY WITH PROPOFOL;  Surgeon: Mauri Pole, MD;  Location: [facility];  Service: Endoscopy;  Laterality: N/A;   ENDOSCOPIC MUCOSAL RESECTION N/A 12/17/2020   Procedure: ENDOSCOPIC MUCOSAL RESECTION;  Surgeon: Mauri Pole, MD;  Location: [facility];  Service: Endoscopy;  Laterality: N/A;   LEFT HEART CATHETERIZATION WITH CORONARY ANGIOGRAM N/A 06/24/2013   Procedure: LEFT HEART CATHETERIZATION WITH CORONARY ANGIOGRAM;  Surgeon: Blane Ohara, MD;  Location: [facility] CATH LAB;  Service: Cardiovascular;  Laterality: N/A;   POLYPECTOMY  12/17/2020   Procedure: POLYPECTOMY;  Surgeon: Mauri Pole, MD;  Location: [facility];  Service: Endoscopy;;   SUBMUCOSAL LIFTING INJECTION  12/17/2020   Procedure: SUBMUCOSAL LIFTING INJECTION;  Surgeon: Mauri Pole, MD;  Location: [facility];  Service: Endoscopy;;   TEMPOROMANDIBULAR JOINT SURGERY     Patient Active Problem List   Diagnosis Date Noted   Left leg pain 08/20/2021   Bilateral hearing loss 08/20/2021   Memory loss 04/27/2021   Hypothyroidism 04/27/2021   Aortic atherosclerosis ([place]) 04/27/2021   Urinary frequency 04/27/2021   Adenomatous polyp of transverse colon    Polyp of ascending colon    Atypical chest pain 12/20/2018   DOE (dyspnea on exertion) 11/07/2018   Dyslipidemia 06/21/2013   Family history of coronary artery disease 06/21/2013    REFERRING DIAG: Left leg pain   THERAPY DIAG:  Pain in left leg  Muscle weakness (generalized)  Other abnormalities of gait and mobility  Rationale for Evaluation and Treatment Rehabilitation  PERTINENT HISTORY: 05/2020 left hamstring tear, previous right ankle fracture 2 years ago, history of left hip bursitis, chronic low back pain   PRECAUTIONS: None    SUBJECTIVE: Patient reports she is doing well. States she has no complaints, but does have some achiness when she gets in bed at night.  PAIN:  Are you having pain? Yes:  NPRS scale: 0/10 Pain location: Left leg (hamstring) Pain description: Burning Aggravating factors: Constant, walking, stairs, squat Relieving factors: Medication, rest    OBJECTIVE: (objective measures completed at  initial evaluation unless otherwise dated) PATIENT SURVEYS:  FOTO 57% functional status  10/05/2021: 66%   MUSCLE LENGTH: Left hamstring length deficit   PALPATION: Tender throughout left proximal-mid hamstring, left hip greater trochanteric region   LOWER EXTREMITY MMT:   MMT Right eval Left eval Rt / Lt 09/29/2021 Rt / Lt 10/12/2021 Rt / Lt 10/19/2021 Rt / Lt 10/26/2021 Rt / Lt Rt / Lt 11/09/2021  Hip flexion 4 4-   4+ / 4   4+ / 4+  Hip extension 4- 3- 4- / 3+ 4- / 3+ 4 / 4-   4 / 4-  Hip abduction 4- 3+ 4- / 4- 4- / 4- 4 / 4- 4 / 4-  4 / 4-  Knee flexion 5 4- 5 / 4  5 / 4  5  / 4+ 5 / 4+  Knee extension $RemoveBefore'5 4   5 '[id number]$ / 5     Ankle dorsiflexion 5 5        Ankle plantarflexion 4 4          FUNCTIONAL TESTS:  Squat: patient demonstrates decreased depth   GAIT: Assistive device utilized: None Level of assistance: Complete Independence Comments: Slight coxalgic gait on left     TODAY'S TREATMENT: [facility] Adult PT Treatment:                                                DATE: 11/09/21 Therapeutic Exercise: Nustep L4 x 6 min with UE/LE while taking subjective Piriformis stretch 2 x 30 sec each Figure-4 bridge 2 x 10 each 90-90 alternating heel tap 2 x 10 Sidelying hip abduction 2 x 15 each Prone hamstring curl with green 2 x 10 each Sit to stand without UE support 2 x 10 SLS 2 x 30 sec each Split squat 2 x 10   [facility] Adult PT Treatment:                                                DATE: 11/02/21 Therapeutic Exercise: Nustep L6 x 5 min with LE while taking subjective Piriformis stretch 2 x 20 sec each Sidelying hip abduction 2 x 15 each Bridge with feet elevated on physioball with slight knee bend 2 x 10 Forward step-up 8" box 2 x 10 Hip hike on edge of box 2 x 10 each Kickstand deadlift 25# from 8" box 3 x 8 each  [facility] Adult PT Treatment:                                                DATE: 10/26/21 Therapeutic Exercise: Nustep L6 x 5 min with LE while taking subjective Piriformis stretch 2 x 20 sec each SLR with 4# 2 x 10 each Prone straight leg hip extension with 4# 2 x 10 each Hamstring curl machine 25# 3 x 10 Standing hip abduction machine 25# 2 x 10 each Deadlift 30# from 4" box 3 x 10 Forward step-up 8" box 2 x 10 SLS 3 x 30 sec each   PATIENT EDUCATION:  Education details: HEP update Person educated: Patient Education method:  Explanation, Demonstration, Tactile cues, Verbal cues, Handout Education comprehension: verbalized understanding, returned demonstration, verbal cues required, tactile cues required, and needs further education   HOME  EXERCISE PROGRAM: Access Code: [email or web address]     ASSESSMENT: CLINICAL IMPRESSION: Patient tolerated therapy well with no adverse effects. Therapy focused primarily on reviewing and progressing her HEP to promote strengthening for home. She demonstrates improved strength this visit and denies any pain with therapy. She reports stairs and getting up from couch are getting much easier and she doesn't have thing burning pain with activity during the day. She would benefit from continued skilled PT to progress her strength and mobility in order to improve her walking tolerance, ability to perform squat and lifting tasks, stair negotiation, and return to prior active lifestyle.    OBJECTIVE IMPAIRMENTS Abnormal gait, decreased activity tolerance, difficulty walking, decreased strength, impaired flexibility, improper body mechanics, and pain.    ACTIVITY LIMITATIONS lifting, bending, standing, squatting, stairs, and locomotion level   PARTICIPATION LIMITATIONS: cleaning, shopping, community activity, and yard work   PERSONAL FACTORS Past/current experiences and Time since onset of injury/illness/exacerbation are also affecting patient's functional outcome.      GOALS: Goals reviewed with patient? Yes   SHORT TERM GOALS: Target date: 10/01/2021    Patient will be I with initial HEP in order to progress with therapy. Baseline: HEP provided at eval 09/29/2021: independent with initial HEP Goal status: MET   2.  PT will review FOTO with patient by 3rd visit in order to understand expected progress and outcome with therapy. Baseline: FOTO assessed at eval Goal status: MET   3.  Patient will be able to negotiate 1 flight of stairs reciprocally using railing as needed to improve household mobility Baseline: patient uses step-to pattern for stairs 10/05/2021: patient reports depends on day/time  10/19/2021: patient reports able to negotiate stairs reciprocally Goal status: MET   LONG TERM GOALS: Target  date: 11/30/2021   Patient will be I with final HEP to maintain progress from PT. Baseline: HEP provided at eval 10/19/2021: progressing with final HEP Goal status: PARTIALLY MET   2.  Patient will report >/= 68% status on FOTO to indicate improved functional ability. Baseline: 57% functional status 10/05/2021: 66% Goal status: PARTIALLY MET   3.  Patient will demonstrate improved strength of left knee >/= 4+/5 MMT and hip >/= 4/5 MMT in order to improve walking tolerance for community access Baseline: patient demonstrates left knee and hip strength deficits 10/19/2021: patient continues to exhibit strength deficits (see above) Goal status: PARTIALLY MET   4.  Patient will report </= 1/10 pain well all activities in order to reduce functional limitations and return to prior activity level Baseline: patient reports 1/10 pain at rest that increases with activity 10/19/2021: patient continues to report occasional pain with activity Goal status: PARTIALLY MET     PLAN: PT FREQUENCY: 1x/week   PT DURATION: 6 weeks   PLANNED INTERVENTIONS: Therapeutic exercises, Therapeutic activity, Neuromuscular re-education, Balance training, Gait training, Patient/Family education, Joint manipulation, Joint mobilization, Stair training, Aquatic Therapy, Dry Needling, Spinal manipulation, Spinal mobilization, Cryotherapy, Moist heat, Taping, Manual therapy, and Re-evaluation   PLAN FOR NEXT SESSION: Review HEP and progress PRN, hip/core/left quad and hamstring strengthening, left hamstring flexibility, squat and hip hinge mechanics, stair training     Hilda Blades, PT, DPT, LAT, ATC 11/09/21  3:03 PM Phone: [phone] Fax: [phone]

## 2021-11-09 ENCOUNTER — Other Ambulatory Visit: Payer: Self-pay

## 2021-11-09 ENCOUNTER — Ambulatory Visit: Payer: Medicare Other | Admitting: Physical Therapy

## 2021-11-09 ENCOUNTER — Encounter: Payer: Self-pay | Admitting: Physical Therapy

## 2021-11-09 DIAGNOSIS — R2689 Other abnormalities of gait and mobility: Secondary | ICD-10-CM

## 2021-11-09 DIAGNOSIS — M79605 Pain in left leg: Secondary | ICD-10-CM

## 2021-11-09 DIAGNOSIS — M6281 Muscle weakness (generalized): Secondary | ICD-10-CM

## 2021-11-09 NOTE — Patient Instructions (Signed)
Access Code: [email or web address] URL: [email or web address] Date: 11/09/2021 Prepared by: Hilda Blades  Exercises - Supine Piriformis Stretch with Foot on Ground  - 1 x daily - 3 reps - 30 seconds hold - Supine Hamstring Stretch  - 1 x daily - 2 sets - 10 reps - Figure 4 Bridge  - 1 x daily - 2 sets - 10 reps - Supine 90/90 Alternating Heel Touches with Posterior Pelvic Tilt  - 1 x daily - 2 sets - 20 reps - Sidelying Hip Abduction  - 1 x daily - 2 sets - 15 reps - Prone Knee Flexion AROM  - 1 x daily - 2 sets - 20 reps - Squat with Chair Touch  - 1 x daily - 2 sets - 10 reps - Single Leg Stance with Support  - 1 x daily - 3 reps - 30 seconds hold - Lunge with Counter Support  - 1 x daily - 2 sets - 10 reps

## 2021-11-15 NOTE — Therapy (Signed)
OUTPATIENT PHYSICAL THERAPY TREATMENT NOTE  DISCHARGE   Patient Name: Patricia Chambers MRN: [id number] DOB:07/19/1950, 71 y.o., female Today's Date: 11/16/2021  PCP: Hoyt Koch, MD   REFERRING PROVIDER: Hoyt Koch, MD   END OF SESSION:   PT End of Session - 11/16/21 1059     Visit Number 12    Number of Visits 15    Date for PT Re-Evaluation 11/30/21    Authorization Type [employer] MEDICARE    Authorization Time Period KX by 15th    Progress Note Due on Visit 20    PT Start Time 1047    PT Stop Time 1130    PT Time Calculation (min) 43 min    Activity Tolerance Patient tolerated treatment well    Behavior During Therapy WFL for tasks assessed/performed                      Past Medical History:  Diagnosis Date   Allergy    Arthritis    Asthma    Cataract    Colon polyp    patient states she had 1 removed 20 years ago   Essential hypertension 06/21/2013   FHx: migraine headaches    Hx of colonic polyps    Hyperlipidemia    Hypertension    Sarcoidosis    Thyroid disease    Past Surgical History:  Procedure Laterality Date   COLONOSCOPY WITH PROPOFOL N/A 12/17/2020   Procedure: COLONOSCOPY WITH PROPOFOL;  Surgeon: Mauri Pole, MD;  Location: [facility];  Service: Endoscopy;  Laterality: N/A;   ENDOSCOPIC MUCOSAL RESECTION N/A 12/17/2020   Procedure: ENDOSCOPIC MUCOSAL RESECTION;  Surgeon: Mauri Pole, MD;  Location: [facility];  Service: Endoscopy;  Laterality: N/A;   LEFT HEART CATHETERIZATION WITH CORONARY ANGIOGRAM N/A 06/24/2013   Procedure: LEFT HEART CATHETERIZATION WITH CORONARY ANGIOGRAM;  Surgeon: Blane Ohara, MD;  Location: [facility] CATH LAB;  Service: Cardiovascular;  Laterality: N/A;   POLYPECTOMY  12/17/2020   Procedure: POLYPECTOMY;  Surgeon: Mauri Pole, MD;  Location: [facility];  Service: Endoscopy;;   SUBMUCOSAL LIFTING INJECTION  12/17/2020   Procedure: SUBMUCOSAL LIFTING INJECTION;  Surgeon:  Mauri Pole, MD;  Location: [facility];  Service: Endoscopy;;   TEMPOROMANDIBULAR JOINT SURGERY     Patient Active Problem List   Diagnosis Date Noted   Left leg pain 08/20/2021   Bilateral hearing loss 08/20/2021   Memory loss 04/27/2021   Hypothyroidism 04/27/2021   Aortic atherosclerosis ([place]) 04/27/2021   Urinary frequency 04/27/2021   Adenomatous polyp of transverse colon    Polyp of ascending colon    Atypical chest pain 12/20/2018   DOE (dyspnea on exertion) 11/07/2018   Dyslipidemia 06/21/2013   Family history of coronary artery disease 06/21/2013    REFERRING DIAG: Left leg pain   THERAPY DIAG:  Pain in left leg  Muscle weakness (generalized)  Other abnormalities of gait and mobility  Rationale for Evaluation and Treatment Rehabilitation  PERTINENT HISTORY: 05/2020 left hamstring tear, previous right ankle fracture 2 years ago, history of left hip bursitis, chronic low back pain   PRECAUTIONS: None    SUBJECTIVE: Patient reports she was sore following last visit and the lunge exercise has been difficulty.  PAIN:  Are you having pain? No NPRS scale: 0/10 Pain location: Left leg (hamstring) Pain description: Sore Aggravating factors: Constant, walking, stairs, squat Relieving factors: Medication, rest    OBJECTIVE: (objective measures completed at initial evaluation unless otherwise dated) PATIENT  SURVEYS:  FOTO 57% functional status  10/05/2021: 66% 11/16/2021: 73%   MUSCLE LENGTH: Left hamstring length deficit   PALPATION: Tender throughout left proximal-mid hamstring, left hip greater trochanteric region   LOWER EXTREMITY MMT:   MMT Right eval Left eval Rt / Lt 09/29/2021 Rt / Lt 10/12/2021 Rt / Lt 10/19/2021 Rt / Lt 10/26/2021 Rt / Lt Rt / Lt 11/09/2021 Left 11/16/2021  Hip flexion 4 4-   4+ / 4   4+ / 4+   Hip extension 4- 3- 4- / 3+ 4- / 3+ 4 / 4-   4 / 4- 4  Hip abduction 4- 3+ 4- / 4- 4- / 4- 4 / 4- 4 / 4-  4 / 4- 4  Knee flexion 5  4- 5 / 4  5 / 4  5 / 4+ 5 / 4+   Knee extension $RemoveBefore'5 4   5 '[id number]$ / 5      Ankle dorsiflexion 5 5         Ankle plantarflexion 4 4           FUNCTIONAL TESTS:  Squat: patient demonstrates decreased depth   GAIT: Assistive device utilized: None Level of assistance: Complete Independence Comments: Slight coxalgic gait on left     TODAY'S TREATMENT: [facility] Adult PT Treatment:                                                DATE: 11/16/21 Therapeutic Exercise: Nustep L4 x 6 min with UE/LE while taking subjective Piriformis stretch 2 x 30 sec each LTR in figure-4 x 3 each Figure-4 bridge 2 x 10 each Sidelying hip abduction 2 x 15 each Split squat with counter support 2 x 10 Hip hike on edge of box 2 x 10 each Kickstand deadlift 25# from 6" box 2 x 8 each   [facility] Adult PT Treatment:                                                DATE: 11/09/21 Therapeutic Exercise: Nustep L4 x 6 min with UE/LE while taking subjective Piriformis stretch 2 x 30 sec each Figure-4 bridge 2 x 10 each 90-90 alternating heel tap 2 x 10 Sidelying hip abduction 2 x 15 each Prone hamstring curl with green 2 x 10 each Sit to stand without UE support 2 x 10 SLS 2 x 30 sec each Split squat 2 x 10  [facility] Adult PT Treatment:                                                DATE: 11/02/21 Therapeutic Exercise: Nustep L6 x 5 min with LE while taking subjective Piriformis stretch 2 x 20 sec each Sidelying hip abduction 2 x 15 each Bridge with feet elevated on physioball with slight knee bend 2 x 10 Forward step-up 8" box 2 x 10 Hip hike on edge of box 2 x 10 each Kickstand deadlift 25# from 8" box 3 x 8 each   PATIENT EDUCATION:  Education details: POC discharge, HEP Person educated: Patient Education method: Consulting civil engineer, Demonstration  Education comprehension: verbalized understanding, returned demonstration   HOME EXERCISE PROGRAM: Access Code: [email or web address]     ASSESSMENT: CLINICAL IMPRESSION: Patient tolerated therapy  well with no adverse effects. Patient has achieved all established goals and is independent with her HEP. She has greatly improved with her strength and functional ability, and PT is no longer indicated. Patient will be formally discharged from PT.     OBJECTIVE IMPAIRMENTS Abnormal gait, decreased activity tolerance, difficulty walking, decreased strength, impaired flexibility, improper body mechanics, and pain.    ACTIVITY LIMITATIONS lifting, bending, standing, squatting, stairs, and locomotion level   PARTICIPATION LIMITATIONS: cleaning, shopping, community activity, and yard work   PERSONAL FACTORS Past/current experiences and Time since onset of injury/illness/exacerbation are also affecting patient's functional outcome.      GOALS: Goals reviewed with patient? Yes   SHORT TERM GOALS: Target date: 10/01/2021    Patient will be I with initial HEP in order to progress with therapy. Baseline: HEP provided at eval 09/29/2021: independent with initial HEP Goal status: MET   2.  PT will review FOTO with patient by 3rd visit in order to understand expected progress and outcome with therapy. Baseline: FOTO assessed at eval Goal status: MET   3.  Patient will be able to negotiate 1 flight of stairs reciprocally using railing as needed to improve household mobility Baseline: patient uses step-to pattern for stairs 10/05/2021: patient reports depends on day/time  10/19/2021: patient reports able to negotiate stairs reciprocally Goal status: MET   LONG TERM GOALS: Target date: 11/30/2021   Patient will be I with final HEP to maintain progress from PT. Baseline: HEP provided at eval 10/19/2021: progressing with final HEP 11/16/2021: independent Goal status: MET   2.  Patient will report >/= 68% status on FOTO to indicate improved functional ability. Baseline: 57% functional status 10/05/2021: 66% 11/16/2021: 73% Goal status: MET   3.  Patient will demonstrate improved strength of left knee  >/= 4+/5 MMT and hip >/= 4/5 MMT in order to improve walking tolerance for community access Baseline: patient demonstrates left knee and hip strength deficits 10/19/2021: patient continues to exhibit strength deficits (see above) 11/16/2021: see above Goal status: MET   4.  Patient will report </= 1/10 pain well all activities in order to reduce functional limitations and return to prior activity level Baseline: patient reports 1/10 pain at rest that increases with activity 10/19/2021: patient continues to report occasional pain with activity 11/16/2021: patient denies pain with general activity Goal status: MET     PLAN: PT FREQUENCY: 1x/week   PT DURATION: 6 weeks   PLANNED INTERVENTIONS: Therapeutic exercises, Therapeutic activity, Neuromuscular re-education, Balance training, Gait training, Patient/Family education, Joint manipulation, Joint mobilization, Stair training, Aquatic Therapy, Dry Needling, Spinal manipulation, Spinal mobilization, Cryotherapy, Moist heat, Taping, Manual therapy, and Re-evaluation   PLAN FOR NEXT SESSION: NA - discharge     Hilda Blades, PT, DPT, LAT, ATC 11/16/21  12:03 PM Phone: [phone] Fax: [phone]   PHYSICAL THERAPY DISCHARGE SUMMARY  Visits from Start of Care: 12  Current functional level related to goals / functional outcomes: See above   Remaining deficits: See above   Education / Equipment: HEP   Patient agrees to discharge. Patient goals were met. Patient is being discharged due to meeting the stated rehab goals.

## 2021-11-16 ENCOUNTER — Ambulatory Visit: Payer: Medicare Other | Admitting: Physical Therapy

## 2021-11-16 ENCOUNTER — Other Ambulatory Visit: Payer: Self-pay

## 2021-11-16 ENCOUNTER — Encounter: Payer: Self-pay | Admitting: Physical Therapy

## 2021-11-16 DIAGNOSIS — R2689 Other abnormalities of gait and mobility: Secondary | ICD-10-CM

## 2021-11-16 DIAGNOSIS — M79605 Pain in left leg: Secondary | ICD-10-CM

## 2021-11-16 DIAGNOSIS — M6281 Muscle weakness (generalized): Secondary | ICD-10-CM

## 2021-12-02 ENCOUNTER — Other Ambulatory Visit: Payer: Self-pay

## 2021-12-02 ENCOUNTER — Emergency Department (HOSPITAL_BASED_OUTPATIENT_CLINIC_OR_DEPARTMENT_OTHER)
Admission: EM | Admit: 2021-12-02 | Discharge: 2021-12-02 | Disposition: A | Discharge status: Home / Self Care | Payer: Medicare Other | Attending: Emergency Medicine | Admitting: Emergency Medicine

## 2021-12-02 DIAGNOSIS — T7840XA Allergy, unspecified, initial encounter: Secondary | ICD-10-CM

## 2021-12-02 MED ORDER — PREDNISONE 10 MG PO TABS: ORAL_TABLET | ORAL | 0 refills | Status: AC

## 2021-12-02 MED ORDER — PREDNISONE 50 MG PO TABS
60.0000 mg | ORAL_TABLET | Freq: Once | ORAL | Status: AC
  Administered 2021-12-02: 60 mg via ORAL
  Filled 2021-12-02: qty 1

## 2021-12-02 MED ORDER — HYDROXYZINE HCL 25 MG PO TABS: 25.0000 mg | ORAL_TABLET | Freq: Three times a day (TID) | ORAL | 0 refills | Status: DC | PRN

## 2021-12-02 MED ORDER — CETIRIZINE HCL 5 MG/5ML PO SOLN
5.0000 mg | Freq: Once | ORAL | Status: AC
Start: 2021-12-02 — End: 2021-12-02
  Administered 2021-12-02: 5 mg via ORAL
  Filled 2021-12-02: qty 5

## 2021-12-02 MED ORDER — EPINEPHRINE 0.3 MG/0.3ML IJ SOAJ: 0.3000 mg | INTRAMUSCULAR | 2 refills | Status: AC | PRN

## 2021-12-02 NOTE — ED Provider Notes (Signed)
[facility] EMERGENCY DEPT Provider Note   CSN: [id number] Arrival date & time: 12/02/21  0932     History  Chief Complaint  Patient presents with   Allergic Reaction    Patricia Chambers is a 71 y.o. female presented to emergency department with allergic reaction.  She reports concern for itchiness and swelling involving her lips and rash on her face.  She reports she got the flu vaccine on Monday.  On Tuesday she began having itching and a raised red rash, small spots on her arms and also around her face.  Her upper lip began swelling.  She has some intermittent scratchiness and itchiness in her throat".  She denies shortness of breath or loss of consciousness.  She reports she does have allergies to dust and cat dander, but has not been around either that she is aware of.  Denies prior history of anaphylaxis.  HPI     Home Medications Prior to Admission medications   Medication Sig Start Date End Date Taking? Authorizing Provider  EPINEPHrine 0.3 mg/0.3 mL IJ SOAJ injection Inject 0.3 mg into the muscle as needed for anaphylaxis. 12/02/21  Yes Hasan Douse, Carola Rhine, MD  hydrOXYzine (ATARAX) 25 MG tablet Take 1 tablet (25 mg total) by mouth every 8 (eight) hours as needed for up to 21 doses. 12/02/21  Yes [facility], Carola Rhine, MD  predniSONE (DELTASONE) 10 MG tablet Take 4 tablets (40 mg total) by mouth daily with breakfast for 3 days, THEN 2 tablets (20 mg total) daily with breakfast for 3 days, THEN 1 tablet (10 mg total) daily with breakfast for 3 days. 12/03/21 12/12/21 Yes Royanne Warshaw, Carola Rhine, MD  fluticasone Asencion Islam) 50 MCG/ACT nasal spray Place 2 sprays into both nostrils daily as needed for allergies or rhinitis.    [provider]  hydroxypropyl methylcellulose / hypromellose (ISOPTO TEARS / GONIOVISC) 2.5 % ophthalmic solution Place 2 drops into both eyes in the morning and at bedtime.    [provider]  levothyroxine (SYNTHROID) 25 MCG tablet Take 1 tablet (25  mcg total) by mouth daily before breakfast. 06/16/21   Hoyt Koch, MD  Multiple Vitamins-Minerals (MULTIVITAMIN WITH MINERALS) tablet Take 1 tablet by mouth daily.    [provider]  ondansetron (ZOFRAN ODT) 4 MG disintegrating tablet Take 1 tablet (4 mg total) by mouth every 8 (eight) hours as needed for nausea or vomiting. Take 1 tablet before prep and then 1 tablet every 8 hours as needed. Patient not taking: Reported on 04/26/2021 12/10/20   Mauri Pole, MD  rosuvastatin (CRESTOR) 10 MG tablet Take 1 tablet (10 mg total) by mouth daily. 01/25/21 01/25/22  Fay Records, MD      Allergies    Naproxen and Penicillins    Review of Systems   Review of Systems  Physical Exam Updated Vital Signs BP (!) 141/87   Pulse 83   Temp 98.5 F (36.9 C) (Oral)   Resp 18   SpO2 95%  Physical Exam Constitutional:      General: She is not in acute distress. HENT:     Head: Normocephalic and atraumatic.  Eyes:     Conjunctiva/sclera: Conjunctivae normal.     Pupils: Pupils are equal, round, and reactive to light.  Cardiovascular:     Rate and Rhythm: Normal rate and regular rhythm.  Pulmonary:     Effort: Pulmonary effort is normal. No respiratory distress.     Comments: Upper lip edema.  No tongue or  uvula involvement.  No stridor.  Voice is clear Abdominal:     General: There is no distension.     Tenderness: There is no abdominal tenderness.  Skin:    General: Skin is warm and dry.     Comments: Erythematous rash with small welts involving the face and a Maller type distribution.  Small satellite itchy rash on bilateral arms.  Neurological:     General: No focal deficit present.     Mental Status: She is alert. Mental status is at baseline.  Psychiatric:        Mood and Affect: Mood normal.        Behavior: Behavior normal.     ED Results / Procedures / Treatments   Labs (all labs ordered are listed, but only abnormal results are displayed) Labs  Reviewed - No data to display  EKG None  Radiology No results found.  Procedures Procedures    Medications Ordered in ED Medications  predniSONE (DELTASONE) tablet 60 mg (has no administration in time range)  cetirizine HCl (Zyrtec) 5 MG/5ML solution 5 mg (has no administration in time range)    ED Course/ Medical Decision Making/ A&P                           Medical Decision Making  Patient is here with suspected allergic reaction, which may be a contact dermatitis versus a topical or environmental allergy.  May also potentially be a reaction to one of the ingredients in the influenza vaccine which she received the day before, although she had no immediate reaction at that time.  She does have some lip swelling.  I do not see any evidence of ongoing anaphylaxis, or angioedema otherwise.  I do think is reasonable to prescribe her an EpiPen given her concerns for itchy throat in the past.  We discussed when to be using an EpiPen at home.  Do not believe she needs epinephrine in the ED.  I will start her on a prednisone course here, advised to continue Benadryl at home, can also prescribe Atarax for itching.  She can follow-up at the allergy clinic, or with her PCP.  She verbalized understanding        Final Clinical Impression(s) / ED Diagnoses Final diagnoses:  Allergic reaction, initial encounter    Rx / DC Orders ED Discharge Orders          Ordered    predniSONE (DELTASONE) 10 MG tablet  Q breakfast        12/02/21 1115    hydrOXYzine (ATARAX) 25 MG tablet  Every 8 hours PRN        12/02/21 1115    EPINEPHrine 0.3 mg/0.3 mL IJ SOAJ injection  As needed        12/02/21 1115              Ashya Nicolaisen, Carola Rhine, MD 12/02/21 1115

## 2021-12-02 NOTE — ED Triage Notes (Signed)
Pt arrived POV, ambulatory. Pt reports she woke up Tuesday morning with swelling in the lips. Pt reports hives on the face, neck, and arms that started yesterday morning and has worsened. Pt caox4 and in no obvious respiratory distress. Pt c/o "itchy throat," and redness around the left eye. Pt denies SOB, no swelling to the tongue/throat, and no problem swallowing. Pt reports having flu shot Monday morning.

## 2021-12-16 ENCOUNTER — Other Ambulatory Visit: Payer: Self-pay | Admitting: Internal Medicine

## 2021-12-16 ENCOUNTER — Other Ambulatory Visit (INDEPENDENT_AMBULATORY_CARE_PROVIDER_SITE_OTHER): Payer: Medicare Other

## 2021-12-16 DIAGNOSIS — E785 Hyperlipidemia, unspecified: Secondary | ICD-10-CM

## 2021-12-16 DIAGNOSIS — E039 Hypothyroidism, unspecified: Secondary | ICD-10-CM | POA: Diagnosis not present

## 2021-12-16 DIAGNOSIS — M79605 Pain in left leg: Secondary | ICD-10-CM

## 2021-12-16 LAB — CBC
HCT: 40.1 % (ref 36.0–46.0)
Hemoglobin: 13.8 g/dL (ref 12.0–15.0)
MCHC: 34.3 g/dL (ref 30.0–36.0)
MCV: 86.7 fl (ref 78.0–100.0)
Platelets: 183 10*3/uL (ref 150.0–400.0)
RBC: 4.63 Mil/uL (ref 3.87–5.11)
RDW: 14.5 % (ref 11.5–15.5)
WBC: 5.6 10*3/uL (ref 4.0–10.5)

## 2021-12-16 LAB — LIPID PANEL
Cholesterol: 128 mg/dL (ref 0–200)
HDL: 49.3 mg/dL (ref >39.00)
LDL Cholesterol: 50 mg/dL (ref 0–99)
NonHDL: 79.1
Total CHOL/HDL Ratio: 3
Triglycerides: 147 mg/dL (ref 0.0–149.0)
VLDL: 29.4 mg/dL (ref 0.0–40.0)

## 2021-12-16 LAB — COMPREHENSIVE METABOLIC PANEL
ALT: 21 U/L (ref 0–35)
AST: 19 U/L (ref 0–37)
Albumin: 4.1 g/dL (ref 3.5–5.2)
Alkaline Phosphatase: 43 U/L (ref 39–117)
BUN: 16 mg/dL (ref 6–23)
CO2: 28 mEq/L (ref 19–32)
Calcium: 9.2 mg/dL (ref 8.4–10.5)
Chloride: 104 mEq/L (ref 96–112)
Creatinine, Ser: 1.03 mg/dL (ref 0.40–1.20)
GFR: 54.6 mL/min — ABNORMAL LOW (ref >60.00)
Glucose, Bld: 110 mg/dL — ABNORMAL HIGH (ref 70–99)
Potassium: 4.3 mEq/L (ref 3.5–5.1)
Sodium: 138 mEq/L (ref 135–145)
Total Bilirubin: 1 mg/dL (ref 0.2–1.2)
Total Protein: 6.6 g/dL (ref 6.0–8.3)

## 2021-12-16 LAB — TSH: TSH: 1.94 u[IU]/mL (ref 0.35–5.50)

## 2021-12-21 ENCOUNTER — Encounter: Payer: Self-pay | Admitting: Internal Medicine

## 2021-12-21 ENCOUNTER — Ambulatory Visit (INDEPENDENT_AMBULATORY_CARE_PROVIDER_SITE_OTHER): Payer: Medicare Other | Admitting: Internal Medicine

## 2021-12-21 VITALS — BP 138/82 | HR 84 | Temp 98.3°F | Ht 67.0 in | Wt 195.0 lb

## 2021-12-21 DIAGNOSIS — I700 Atherosclerosis of aorta: Secondary | ICD-10-CM | POA: Diagnosis not present

## 2021-12-21 DIAGNOSIS — D123 Benign neoplasm of transverse colon: Secondary | ICD-10-CM

## 2021-12-21 DIAGNOSIS — Z86010 Personal history of colonic polyps: Secondary | ICD-10-CM | POA: Diagnosis not present

## 2021-12-21 DIAGNOSIS — Z0000 Encounter for general adult medical examination without abnormal findings: Secondary | ICD-10-CM | POA: Diagnosis not present

## 2021-12-21 DIAGNOSIS — E039 Hypothyroidism, unspecified: Secondary | ICD-10-CM

## 2021-12-21 DIAGNOSIS — R7301 Impaired fasting glucose: Secondary | ICD-10-CM | POA: Diagnosis not present

## 2021-12-21 LAB — HEMOGLOBIN A1C: Hgb A1c MFr Bld: 5.8 % (ref 4.6–6.5)

## 2021-12-21 MED ORDER — ROSUVASTATIN CALCIUM 10 MG PO TABS: 10.0000 mg | ORAL_TABLET | Freq: Every day | ORAL | 3 refills | Status: DC

## 2021-12-21 NOTE — Patient Instructions (Addendum)
We will get you in with GI for the colonoscopy. Let us know which neurologist you want to see.

## 2021-12-21 NOTE — Progress Notes (Signed)
   Subjective:   Patient ID: Patricia Chambers, female    DOB: [birth year], 71 y.o.   MRN: [id number]  HPI The patient is here for physical.  PMH, [facility], social history reviewed and updated  Review of Systems  Constitutional: Negative.   HENT: Negative.    Eyes: Negative.   Respiratory:  Negative for cough, chest tightness and shortness of breath.   Cardiovascular:  Negative for chest pain, palpitations and leg swelling.  Gastrointestinal:  Negative for abdominal distention, abdominal pain, constipation, diarrhea, nausea and vomiting.  Musculoskeletal: Negative.   Skin: Negative.   Neurological: Negative.   Psychiatric/Behavioral: Negative.      Objective:  Physical Exam Constitutional:      Appearance: She is well-developed.  HENT:     Head: Normocephalic and atraumatic.  Cardiovascular:     Rate and Rhythm: Normal rate and regular rhythm.  Pulmonary:     Effort: Pulmonary effort is normal. No respiratory distress.     Breath sounds: Normal breath sounds. No wheezing or rales.  Abdominal:     General: Bowel sounds are normal. There is no distension.     Palpations: Abdomen is soft.     Tenderness: There is no abdominal tenderness. There is no rebound.  Musculoskeletal:     Cervical back: Normal range of motion.  Skin:    General: Skin is warm and dry.  Neurological:     Mental Status: She is alert and oriented to person, place, and time.     Coordination: Coordination normal.    Vitals:   12/21/21 0918  BP: 138/82  Pulse: 84  Temp: 98.3 F (36.8 C)  SpO2: 99%  Weight: 195 lb (88.5 kg)  Height: '5\' 7"'$  (1.702 m)    Assessment & Plan:

## 2021-12-24 DIAGNOSIS — R7301 Impaired fasting glucose: Secondary | ICD-10-CM | POA: Insufficient documentation

## 2021-12-24 DIAGNOSIS — Z0000 Encounter for general adult medical examination without abnormal findings: Secondary | ICD-10-CM | POA: Insufficient documentation

## 2021-12-24 NOTE — Assessment & Plan Note (Signed)
Flu shot up to date. Covid-19 counseled. Pneumonia complete. Shingrix done. Tetanus at pharmacy due. Colonoscopy due referral to GI. Mammogram up to date, pap smear aged out and dexa up to date. Counseled about sun safety and mole surveillance. Counseled about the dangers of distracted driving. Given 10 year screening recommendations.

## 2021-12-24 NOTE — Assessment & Plan Note (Signed)
Due for colonoscopy and referral to GI done

## 2021-12-24 NOTE — Assessment & Plan Note (Signed)
Checking HgA1c today as sugars elevated on screening labs. No signs of diabetes.

## 2021-12-24 NOTE — Assessment & Plan Note (Signed)
Reviewed labs and at goal on synthroid 25 mcg daily. Continue.

## 2021-12-24 NOTE — Assessment & Plan Note (Signed)
Rx crestor 10 mg daily as she agrees to this treatment.

## 2021-12-27 ENCOUNTER — Encounter: Payer: Self-pay | Admitting: Gastroenterology

## 2021-12-29 ENCOUNTER — Telehealth: Payer: Self-pay | Admitting: Gastroenterology

## 2021-12-29 NOTE — Telephone Encounter (Signed)
Patient called states she so need her colonoscopy, but wondering if she will be doing it at [facility] or if it needs to be in our office. Also states has few questions before proceeding on scheduling. Requesting a call back on her home phone. Please call to advise.

## 2021-12-30 NOTE — Telephone Encounter (Signed)
She can have follow-up surveillance colonoscopy at [facility].  Thank you

## 2021-12-30 NOTE — Telephone Encounter (Signed)
The patient had the last colonoscopy in the hospital in order to have access the EMR heater probe.  The 1 year recall was due to having to remove the polyp piecemeal. Do you want this colonoscopy to be done at [facility] again or the [facility]?

## 2021-12-31 NOTE — Telephone Encounter (Signed)
On chart review , doesn't have h/o difficult intubation and she was not intubated for the last colonoscopy, it was MAC. Thanks

## 2022-01-03 ENCOUNTER — Other Ambulatory Visit: Payer: Self-pay

## 2022-01-03 MED ORDER — ONDANSETRON HCL 4 MG PO TABS: 4.0000 mg | ORAL_TABLET | Freq: Three times a day (TID) | ORAL | 0 refills | Status: AC | PRN

## 2022-01-03 NOTE — Telephone Encounter (Signed)
Patient advised. She will pick up the prescription and hold it until the prep begins.

## 2022-01-03 NOTE — Telephone Encounter (Signed)
Patient scheduled. She had problems with nausea when prepping for her colonoscopy the first time. The Zofran she was given the second time helped tremendously. Can she have Zofran again this time?

## 2022-01-03 NOTE — Telephone Encounter (Signed)
Yes okay to send prescription for Zofran 4 mg every 8 hours as needed X 10 tabs with no refills.  Thank you

## 2022-01-18 ENCOUNTER — Ambulatory Visit (AMBULATORY_SURGERY_CENTER): Payer: Self-pay | Admitting: *Deleted

## 2022-01-18 VITALS — Ht 67.0 in | Wt 198.8 lb

## 2022-01-18 DIAGNOSIS — Z86010 Personal history of colonic polyps: Secondary | ICD-10-CM

## 2022-01-18 MED ORDER — NA SULFATE-K SULFATE-MG SULF 17.5-3.13-1.6 GM/177ML PO SOLN: 1.0000 | Freq: Once | ORAL | 0 refills | Status: AC

## 2022-01-18 NOTE — Progress Notes (Signed)

## 2022-01-19 ENCOUNTER — Encounter: Payer: Self-pay | Admitting: Gastroenterology

## 2022-01-25 ENCOUNTER — Ambulatory Visit (AMBULATORY_SURGERY_CENTER): Payer: Medicare Other | Admitting: Gastroenterology

## 2022-01-25 ENCOUNTER — Encounter: Payer: Self-pay | Admitting: Gastroenterology

## 2022-01-25 VITALS — BP 139/77 | HR 71 | Temp 97.7°F | Resp 16 | Ht 67.0 in | Wt 198.8 lb

## 2022-01-25 DIAGNOSIS — Z86010 Personal history of colonic polyps: Secondary | ICD-10-CM | POA: Diagnosis not present

## 2022-01-25 DIAGNOSIS — Z09 Encounter for follow-up examination after completed treatment for conditions other than malignant neoplasm: Secondary | ICD-10-CM | POA: Diagnosis not present

## 2022-01-25 MED ORDER — SODIUM CHLORIDE 0.9 % IV SOLN: 500.0000 mL | Freq: Once | INTRAVENOUS | Status: DC

## 2022-01-25 NOTE — Progress Notes (Signed)
Report given to PACU, vss 

## 2022-01-25 NOTE — Patient Instructions (Addendum)
- Resume previous diet. - Continue present medications. - Repeat colonoscopy in 3 years for surveillance.  There were no polyps seen today!  You will need another screening colonoscopy in 3 years, you will receive a letter at that time when you are due for the procedure.    Please call us at [phone] if you have a change in bowel habits, change in family history of colo-rectal cancer, rectal bleeding or other GI concern before that time.   Handout given for diverticulosis.  YOU HAD AN ENDOSCOPIC PROCEDURE TODAY AT [facility]:   Refer to the procedure report that was given to you for any specific questions about what was found during the examination.  If the procedure report does not answer your questions, please call your gastroenterologist to clarify.  If you requested that your care partner not be given the details of your procedure findings, then the procedure report has been included in a sealed envelope for you to review at your convenience later.  YOU SHOULD EXPECT: Some feelings of bloating in the abdomen. Passage of more gas than usual.  Walking can help get rid of the air that was put into your GI tract during the procedure and reduce the bloating. If you had a lower endoscopy (such as a colonoscopy or flexible sigmoidoscopy) you may notice spotting of blood in your stool or on the toilet paper. If you underwent a bowel prep for your procedure, you may not have a normal bowel movement for a few days.  Please Note:  You might notice some irritation and congestion in your nose or some drainage.  This is from the oxygen used during your procedure.  There is no need for concern and it should clear up in a day or so.  SYMPTOMS TO REPORT IMMEDIATELY:  Following lower endoscopy (colonoscopy):  Excessive amounts of blood in the stool  Significant tenderness or worsening of abdominal pains  Swelling of the abdomen that is new, acute  Fever of 100F or higher For urgent  or emergent issues, a gastroenterologist can be reached at any hour by calling [phone]. Do not use MyChart messaging for urgent concerns.    DIET:  We do recommend a small meal at first, but then you may proceed to your regular diet.  Drink plenty of fluids but you should avoid alcoholic beverages for 24 hours.  ACTIVITY:  You should plan to take it easy for the rest of today and you should NOT DRIVE or use heavy machinery until tomorrow (because of the sedation medicines used during the test).    FOLLOW UP: Our staff will call the number listed on your records the next business day following your procedure.  We will call around 7:15- 8:00 am to check on you and address any questions or concerns that you may have regarding the information given to you following your procedure. If we do not reach you, we will leave a message.     If any biopsies were taken you will be contacted by phone or by letter within the next 1-3 weeks.  Please call us at [phone] if you have not heard about the biopsies in 3 weeks.    SIGNATURES/CONFIDENTIALITY: You and/or your care partner have signed paperwork which will be entered into your electronic medical record.  These signatures attest to the fact that that the information above on your After Visit Summary has been reviewed and is understood.  Full responsibility of the confidentiality of  this discharge information lies with you and/or your care-partner.

## 2022-01-25 NOTE — Progress Notes (Signed)
[facility] History and Physical   Primary Care Physician:  Hoyt Koch, MD   Reason for Procedure:  History of adenomatous colon polyps  Plan:    Surveillance colonoscopy with possible interventions as needed     HPI: Jerricka Mcgonagle is a very pleasant 71 y.o. female here for surveillance colonoscopy. Denies any nausea, vomiting, abdominal pain, melena or bright red blood per rectum  The risks and benefits as well as alternatives of endoscopic procedure(s) have been discussed and reviewed. All questions answered. The patient agrees to proceed.    Past Medical History:  Diagnosis Date   Allergy    Arthritis    Asthma    Cataract    Colon polyp    patient states she had 1 removed 20 years ago   Essential hypertension 06/21/2013   FHx: migraine headaches    Hx of colonic polyps    Hyperlipidemia    Hypertension    Sarcoidosis    Thyroid disease    Wears hearing aid    BILATERAL    Past Surgical History:  Procedure Laterality Date   BROKEN ANKLE Right    BOOT   COLONOSCOPY     COLONOSCOPY WITH PROPOFOL N/A 12/17/2020   Procedure: COLONOSCOPY WITH PROPOFOL;  Surgeon: Mauri Pole, MD;  Location: [facility];  Service: Endoscopy;  Laterality: N/A;   ENDOSCOPIC MUCOSAL RESECTION N/A 12/17/2020   Procedure: ENDOSCOPIC MUCOSAL RESECTION;  Surgeon: Mauri Pole, MD;  Location: [facility];  Service: Endoscopy;  Laterality: N/A;   HAMSTRING TEAR Left    LEFT HEART CATHETERIZATION WITH CORONARY ANGIOGRAM N/A 06/24/2013   Procedure: LEFT HEART CATHETERIZATION WITH CORONARY ANGIOGRAM;  Surgeon: Blane Ohara, MD;  Location: [facility] CATH LAB;  Service: Cardiovascular;  Laterality: N/A;   POLYPECTOMY  12/17/2020   Procedure: POLYPECTOMY;  Surgeon: Mauri Pole, MD;  Location: [facility];  Service: Endoscopy;;   POLYPECTOMY     SUBMUCOSAL LIFTING INJECTION  12/17/2020   Procedure: SUBMUCOSAL LIFTING INJECTION;  Surgeon: Mauri Pole, MD;  Location: [facility];  Service: Endoscopy;;   TEMPOROMANDIBULAR JOINT SURGERY      Prior to Admission medications   Medication Sig Start Date End Date Taking? Authorizing Provider  acetaminophen (TYLENOL) 500 MG tablet Take 500 mg by mouth as needed.   Yes [provider]  Carboxymethylcellulose Sodium (REFRESH TEARS OP) Apply to eye in the morning and at bedtime. TAKE 2 DROPS TWICE A DAY   Yes [provider]  levothyroxine (SYNTHROID) 25 MCG tablet Take 1 tablet (25 mcg total) by mouth daily before breakfast. 06/16/21  Yes Hoyt Koch, MD  Multiple Vitamins-Minerals (MULTIVITAMIN WITH MINERALS) tablet Take 1 tablet by mouth daily.   Yes [provider]  ondansetron (ZOFRAN) 4 MG tablet Take 1 tablet (4 mg total) by mouth every 8 (eight) hours as needed for nausea or vomiting. 01/03/22  Yes Judithann Villamar, Venia Minks, MD  rosuvastatin (CRESTOR) 10 MG tablet Take 1 tablet (10 mg total) by mouth daily. 12/21/21  Yes Hoyt Koch, MD  EPINEPHrine 0.3 mg/0.3 mL IJ SOAJ injection Inject 0.3 mg into the muscle as needed for anaphylaxis. Patient not taking: Reported on 01/18/2022 12/02/21   Wyvonnia Dusky, MD  fluticasone [facility]) 50 MCG/ACT nasal spray Place 2 sprays into both nostrils daily as needed for allergies or rhinitis.    [provider]  hydroxypropyl methylcellulose / hypromellose (ISOPTO TEARS / GONIOVISC) 2.5 % ophthalmic solution Place 2 drops into both eyes  in the morning and at bedtime. Patient not taking: Reported on 01/18/2022    [provider]    Current Outpatient Medications  Medication Sig Dispense Refill   acetaminophen (TYLENOL) 500 MG tablet Take 500 mg by mouth as needed.     Carboxymethylcellulose Sodium (REFRESH TEARS OP) Apply to eye in the morning and at bedtime. TAKE 2 DROPS TWICE A DAY     levothyroxine (SYNTHROID) 25 MCG tablet Take 1 tablet (25 mcg total) by mouth daily before breakfast. 90  tablet 3   Multiple Vitamins-Minerals (MULTIVITAMIN WITH MINERALS) tablet Take 1 tablet by mouth daily.     ondansetron (ZOFRAN) 4 MG tablet Take 1 tablet (4 mg total) by mouth every 8 (eight) hours as needed for nausea or vomiting. 10 tablet 0   rosuvastatin (CRESTOR) 10 MG tablet Take 1 tablet (10 mg total) by mouth daily. 90 tablet 3   EPINEPHrine 0.3 mg/0.3 mL IJ SOAJ injection Inject 0.3 mg into the muscle as needed for anaphylaxis. (Patient not taking: Reported on 01/18/2022) 1 each 2   fluticasone (FLONASE) 50 MCG/ACT nasal spray Place 2 sprays into both nostrils daily as needed for allergies or rhinitis.     hydroxypropyl methylcellulose / hypromellose (ISOPTO TEARS / GONIOVISC) 2.5 % ophthalmic solution Place 2 drops into both eyes in the morning and at bedtime. (Patient not taking: Reported on 01/18/2022)     Current Facility-Administered Medications  Medication Dose Route Frequency Provider Last Rate Last Admin   0.9 %  sodium chloride infusion  500 mL Intravenous Once Mauri Pole, MD        Allergies as of 01/25/2022 - Review Complete 01/25/2022  Allergen Reaction Noted   Naproxen  06/20/2013   Penicillins Other (See Comments) 06/08/2010    Family History  Problem Relation Age of Onset   Diverticulitis Mother    Heart disease Mother    Colon polyps Brother    Diverticulitis Brother    Crohn's disease Child    Colon cancer Neg Hx    Pancreatic cancer Neg Hx    Esophageal cancer Neg Hx    Rectal cancer Neg Hx    Stomach cancer Neg Hx    Ulcerative colitis Neg Hx     Social History   Socioeconomic History   Marital status: Widowed    Spouse name: Not on file   Number of children: Not on file   Years of education: Not on file   Highest education level: Not on file  Occupational History   Not on file  Tobacco Use   Smoking status: Former    Packs/day: 1.00    Years: 5.00    Total pack years: 5.00    Types: Cigarettes    Quit date: 03/28/1974    Years  since quitting: 47.8    Passive exposure: Past (PARENTS SMOKED)   Smokeless tobacco: Never  Vaping Use   Vaping Use: Never used  Substance and Sexual Activity   Alcohol use: Not Currently    Alcohol/week: 5.0 standard drinks of alcohol    Types: 5 Glasses of wine per week    Comment: Or more   Drug use: No   Sexual activity: Not Currently  Other Topics Concern   Not on file  Social History Narrative   Not on file   Social Determinants of Health   Financial Resource Strain: Not on file  Food Insecurity: Not on file  Transportation Needs: Not on file  Physical Activity: Not on file  Stress: Not on file  Social Connections: Not on file  Intimate Partner Violence: Not on file    Review of Systems:  All other review of systems negative except as mentioned in the HPI.  Physical Exam: Vital signs in last 24 hours: Blood Pressure 127/76   Pulse 85   Temperature 97.7 F (36.5 C) (Temporal)   Height '5\' 7"'$  (1.702 m)   Weight 198 lb 12.8 oz (90.2 kg)   Oxygen Saturation 97%   Body Mass Index 31.14 kg/m  General:   Alert, NAD Lungs:  Clear .   Heart:  Regular rate and rhythm Abdomen:  Soft, nontender and nondistended. Neuro/Psych:  Alert and cooperative. Normal mood and affect. A and O x 3  Reviewed labs, radiology imaging, old records and pertinent past GI work up  Patient is appropriate for planned procedure(s) and anesthesia in an ambulatory setting   K. Denzil Magnuson , MD [phone]

## 2022-01-25 NOTE — Op Note (Signed)
[place] Patient Name: Patricia Chambers Procedure Date: 01/25/2022 12:16 PM MRN: [id number] Endoscopist: Mauri Pole , MD, [id number] Age: 71 Referring MD:  Date of Birth: Nov 10, 1950 Gender: Female Account #: [id number] Procedure:                Colonoscopy Indications:              High risk colon cancer surveillance: Personal                            history of colonic polyps, Surveillance: Piecemeal                            removal of large sessile adenoma last colonoscopy                            (< 3 yrs), High risk colon cancer surveillance:                            Personal history of sessile serrated colon polyp                            (10 mm or greater in size) Medicines:                Monitored Anesthesia Care Procedure:                Pre-Anesthesia Assessment:                           - Prior to the procedure, a History and Physical                            was performed, and patient medications and                            allergies were reviewed. The patient's tolerance of                            previous anesthesia was also reviewed. The risks                            and benefits of the procedure and the sedation                            options and risks were discussed with the patient.                            All questions were answered, and informed consent                            was obtained. Prior Anticoagulants: The patient has                            taken no anticoagulant or antiplatelet agents. ASA  Grade Assessment: III - A patient with severe                            systemic disease. After reviewing the risks and                            benefits, the patient was deemed in satisfactory                            condition to undergo the procedure.                           After obtaining informed consent, the colonoscope                            was passed under direct  vision. Throughout the                            procedure, the patient's blood pressure, pulse, and                            oxygen saturations were monitored continuously. The                            Olympus PCF-H190DL (#[id number]) Colonoscope was                            introduced through the anus and advanced to the the                            cecum, identified by appendiceal orifice and                            ileocecal valve. The colonoscopy was performed                            without difficulty. The patient tolerated the                            procedure well. The quality of the bowel                            preparation was excellent. The ileocecal valve,                            appendiceal orifice, and rectum were photographed. Scope In: 12:20:41 PM Scope Out: 12:31:16 PM Scope Withdrawal Time: 0 hours 6 minutes 44 seconds  Total Procedure Duration: 0 hours 10 minutes 35 seconds  Findings:                 The perianal and digital rectal examinations were                            normal.  Scattered large-mouthed diverticula were found in                            the sigmoid colon, descending colon, transverse                            colon, ascending colon and cecum.                           A 4 mm post polypectomy scar was found in the                            transverse colon. There was no evidence of the                            previous polyp.                           A tattoo was seen in the transverse colon. A                            post-polypectomy scar was found at the tattoo site.                           Non-bleeding external and internal hemorrhoids were                            found during retroflexion. The hemorrhoids were                            medium-sized. Complications:            No immediate complications. Estimated Blood Loss:     Estimated blood loss was minimal. Impression:                - Moderate diverticulosis in the sigmoid colon, in                            the descending colon, in the transverse colon, in                            the ascending colon and in the cecum.                           - Post-polypectomy scar in the transverse colon.                           - A tattoo was seen in the transverse colon. A                            post-polypectomy scar was found at the tattoo site.                           - Non-bleeding external and internal hemorrhoids.                           -  No specimens collected. Recommendation:           - Patient has a contact number available for                            emergencies. The signs and symptoms of potential                            delayed complications were discussed with the                            patient. Return to normal activities tomorrow.                            Written discharge instructions were provided to the                            patient.                           - Resume previous diet.                           - Continue present medications.                           - Repeat colonoscopy in 3 years for surveillance. Mauri Pole, MD 01/25/2022 12:35:50 PM This report has been signed electronically.

## 2022-01-25 NOTE — Progress Notes (Signed)
VS completed by DT.  Pt's states no medical or surgical changes since previsit or office visit.  

## 2022-01-26 ENCOUNTER — Telehealth: Payer: Self-pay | Admitting: *Deleted

## 2022-01-26 NOTE — Telephone Encounter (Signed)
  Follow up Call-     01/25/2022   11:17 AM 10/22/2020    9:24 AM  Call back number  Post procedure Call Back phone  # [phone]  Permission to leave phone message Yes Yes     Patient questions:  Do you have a fever, pain , or abdominal swelling? No. Pain Score  0 *  Have you tolerated food without any problems? Yes.    Have you been able to return to your normal activities? Yes.    Do you have any questions about your discharge instructions: Diet   No. Medications  No. Follow up visit  No.  Do you have questions or concerns about your Care? No.  Actions: * If pain score is 4 or above: No action needed, pain <4.

## 2022-01-27 ENCOUNTER — Other Ambulatory Visit: Payer: Self-pay | Admitting: Internal Medicine

## 2022-01-27 DIAGNOSIS — Z1231 Encounter for screening mammogram for malignant neoplasm of breast: Secondary | ICD-10-CM

## 2022-02-24 ENCOUNTER — Telehealth: Payer: Self-pay | Admitting: Internal Medicine

## 2022-02-24 NOTE — Telephone Encounter (Signed)
Left message for patient to call back to schedule Medicare Annual Wellness Visit   Last AWV  09/25/20  Please schedule at anytime with [facility] if patient calls the office back.     Any questions, please call me at [phone]

## 2022-03-04 ENCOUNTER — Encounter: Payer: Self-pay | Admitting: Internal Medicine

## 2022-03-04 ENCOUNTER — Ambulatory Visit: Payer: Medicare Other | Admitting: Internal Medicine

## 2022-03-04 VITALS — BP 120/76 | HR 95 | Temp 99.4°F | Ht 67.0 in | Wt 200.0 lb

## 2022-03-04 DIAGNOSIS — M791 Myalgia, unspecified site: Secondary | ICD-10-CM

## 2022-03-04 DIAGNOSIS — H04123 Dry eye syndrome of bilateral lacrimal glands: Secondary | ICD-10-CM | POA: Diagnosis not present

## 2022-03-04 DIAGNOSIS — R413 Other amnesia: Secondary | ICD-10-CM | POA: Diagnosis not present

## 2022-03-04 DIAGNOSIS — Z0000 Encounter for general adult medical examination without abnormal findings: Secondary | ICD-10-CM | POA: Diagnosis not present

## 2022-03-04 DIAGNOSIS — M2550 Pain in unspecified joint: Secondary | ICD-10-CM | POA: Insufficient documentation

## 2022-03-04 LAB — C-REACTIVE PROTEIN: CRP: <1 mg/dL (ref 0.5–20.0)

## 2022-03-04 LAB — CK: Total CK: 251 U/L — ABNORMAL HIGH (ref 7–177)

## 2022-03-04 NOTE — Assessment & Plan Note (Signed)
We have discussed previously she felt medications were related to some cognitive change. She feels like this is stable now and she can undergo evaluation. Refer to neurology. Prior MRI obtained for same symptoms was normal. No significant change in the last 6 months. No family members present to verify.

## 2022-03-04 NOTE — Assessment & Plan Note (Signed)
Flu shot up to date. Pneumonia complete. Had zostavax declines shingrix. Tetanus due at pharmacy. Colonoscopy done due 2026. Mammogram due 2024, pap smear aged out and dexa complete. Counseled about sun safety and mole surveillance. Counseled about the dangers of distracted driving. Given 10 year screening recommendations.

## 2022-03-04 NOTE — Assessment & Plan Note (Signed)
Eye specialist concerned about lupus or RA. Checking ANA panel and RF.

## 2022-03-04 NOTE — Assessment & Plan Note (Signed)
Eye specialist concerned about sjogren's so checking ANA panel with antibodies to rule this out.

## 2022-03-04 NOTE — Progress Notes (Signed)
Subjective:   Patient ID: Patricia Chambers, female    DOB: 04-30-1950, 71 y.o.   MRN: [id number]  HPI Here for medicare wellness, with new complaints about . Please see A/P for status and treatment of chronic medical problems.   Diet: heart healthy Physical activity: sedentary Depression/mood screen: negative Hearing: intact to whispered voice Visual acuity: significant dry eye and pain, performs annual eye exam  ADLs: capable Fall risk: low Home safety: good Cognitive evaluation: some memory changes previously assessed EOL planning: adv directives discussed, in place  [employer] Visit from 03/04/2022 in [place] at [place] Visit from 03/04/2022 in [place] at [facility]  PHQ-9 Total Score 0         06/17/2020    5:39 AM 12/17/2020    9:56 AM 12/02/2021    9:50 AM 12/21/2021    9:20 AM 03/04/2022    3:26 PM  [place] in the past year?    0 0  Was there an injury with Fall?    0 0  Fall Risk Category Calculator    0 0  Fall Risk Category    Low Low  Patient Fall Risk Level Low fall risk Moderate fall risk Low fall risk  Low fall risk  Patient at Risk for Falls Due to     No Fall Risks  Fall risk Follow up     Falls evaluation completed    I have personally reviewed and have noted 1. The patient's medical and social history - reviewed today no changes 2. Their use of alcohol, tobacco or illicit drugs 3. Their current medications and supplements 4. The patient's functional ability including ADL's, fall risks, home safety risks and hearing or visual impairment. 5. Diet and physical activities 6. Evidence for depression or mood disorders 7. Care team reviewed and updated 8.  The patient is not on an opioid pain medication.  Patient Care Team: Hoyt Koch, MD as PCP - General (Internal Medicine) Past Medical History:  Diagnosis Date   Allergy    Arthritis    Asthma     Cataract    Colon polyp    patient states she had 1 removed 20 years ago   Essential hypertension 06/21/2013   FHx: migraine headaches    Hx of colonic polyps    Hyperlipidemia    Hypertension    Sarcoidosis    Thyroid disease    Wears hearing aid    BILATERAL   Past Surgical History:  Procedure Laterality Date   BROKEN ANKLE Right    BOOT   COLONOSCOPY     COLONOSCOPY WITH PROPOFOL N/A 12/17/2020   Procedure: COLONOSCOPY WITH PROPOFOL;  Surgeon: Mauri Pole, MD;  Location: [facility];  Service: Endoscopy;  Laterality: N/A;   ENDOSCOPIC MUCOSAL RESECTION N/A 12/17/2020   Procedure: ENDOSCOPIC MUCOSAL RESECTION;  Surgeon: Mauri Pole, MD;  Location: [facility];  Service: Endoscopy;  Laterality: N/A;   HAMSTRING TEAR Left    LEFT HEART CATHETERIZATION WITH CORONARY ANGIOGRAM N/A 06/24/2013   Procedure: LEFT HEART CATHETERIZATION WITH CORONARY ANGIOGRAM;  Surgeon: Blane Ohara, MD;  Location: [facility] CATH LAB;  Service: Cardiovascular;  Laterality: N/A;   POLYPECTOMY  12/17/2020   Procedure: POLYPECTOMY;  Surgeon: Mauri Pole, MD;  Location: [facility];  Service: Endoscopy;;   POLYPECTOMY     SUBMUCOSAL LIFTING INJECTION  12/17/2020  Procedure: SUBMUCOSAL LIFTING INJECTION;  Surgeon: Mauri Pole, MD;  Location: [facility];  Service: Endoscopy;;   TEMPOROMANDIBULAR JOINT SURGERY     Family History  Problem Relation Age of Onset   Diverticulitis Mother    Heart disease Mother    Colon polyps Brother    Diverticulitis Brother    Crohn's disease Child    Colon cancer Neg Hx    Pancreatic cancer Neg Hx    Esophageal cancer Neg Hx    Rectal cancer Neg Hx    Stomach cancer Neg Hx    Ulcerative colitis Neg Hx    Review of Systems  Constitutional: Negative.   HENT: Negative.    Eyes:  Positive for pain.  Respiratory:  Negative for cough, chest tightness and shortness of breath.   Cardiovascular:  Negative for chest pain, palpitations and  leg swelling.  Gastrointestinal:  Negative for abdominal distention, abdominal pain, constipation, diarrhea, nausea and vomiting.  Musculoskeletal:  Positive for arthralgias.  Skin: Negative.   Neurological: Negative.   Psychiatric/Behavioral: Negative.      Objective:  Physical Exam Constitutional:      Appearance: She is well-developed.  HENT:     Head: Normocephalic and atraumatic.  Eyes:     Comments: Dry eyes  Cardiovascular:     Rate and Rhythm: Normal rate and regular rhythm.  Pulmonary:     Effort: Pulmonary effort is normal. No respiratory distress.     Breath sounds: Normal breath sounds. No wheezing or rales.  Abdominal:     General: Bowel sounds are normal. There is no distension.     Palpations: Abdomen is soft.     Tenderness: There is no abdominal tenderness. There is no rebound.  Musculoskeletal:        General: Tenderness present.     Cervical back: Normal range of motion.  Skin:    General: Skin is warm and dry.  Neurological:     Mental Status: She is alert and oriented to person, place, and time.     Coordination: Coordination normal.     Vitals:   03/04/22 1527  BP: 120/76  Pulse: 95  Temp: 99.4 F (37.4 C)  TempSrc: Oral  SpO2: 98%  Weight: 200 lb (90.7 kg)  Height: '5\' 7"'$  (1.702 m)    Assessment & Plan:

## 2022-03-06 NOTE — Progress Notes (Deleted)
Cardiology Office Note   Date:  03/06/2022   ID:  Patricia Chambers, DOB [birth year], MRN [id number]  PCP:  Hoyt Koch, MD  Cardiologist:   Dorris Carnes, MD   Pt presents for f/u of CP   has questions       History of Present Illness: Patricia Chambers is a 71 y.o. female with a history of CP   I saw her in 32   Eval was a stress test   This lead to a cardiac cath whic is normal    Carotid USN  with minimal plalquing    The pt was last in cardiology clinic in 2015   She had concerns and wanted reeval  Currently denies CP   Breathing is [facility] says she does not an irregular HB  Usually at night    Seen in ED x 2   Nothing  found   No dizzienss Says her BP is high in clnic but better at home      Pt does not some fatigue    I saw the pt in 2022  No outpatient medications have been marked as taking for the 03/09/22 encounter (Appointment) with Fay Records, MD.     Allergies:   Naproxen and Penicillins   Past Medical History:  Diagnosis Date   Allergy    Arthritis    Asthma    Cataract    Colon polyp    patient states she had 1 removed 20 years ago   Essential hypertension 06/21/2013   FHx: migraine headaches    Hx of colonic polyps    Hyperlipidemia    Hypertension    Sarcoidosis    Thyroid disease    Wears hearing aid    BILATERAL    Past Surgical History:  Procedure Laterality Date   BROKEN ANKLE Right    BOOT   COLONOSCOPY     COLONOSCOPY WITH PROPOFOL N/A 12/17/2020   Procedure: COLONOSCOPY WITH PROPOFOL;  Surgeon: Mauri Pole, MD;  Location: [facility];  Service: Endoscopy;  Laterality: N/A;   ENDOSCOPIC MUCOSAL RESECTION N/A 12/17/2020   Procedure: ENDOSCOPIC MUCOSAL RESECTION;  Surgeon: Mauri Pole, MD;  Location: [facility];  Service: Endoscopy;  Laterality: N/A;   HAMSTRING TEAR Left    LEFT HEART CATHETERIZATION WITH CORONARY ANGIOGRAM N/A 06/24/2013   Procedure: LEFT HEART CATHETERIZATION WITH CORONARY ANGIOGRAM;   Surgeon: Blane Ohara, MD;  Location: [facility] CATH LAB;  Service: Cardiovascular;  Laterality: N/A;   POLYPECTOMY  12/17/2020   Procedure: POLYPECTOMY;  Surgeon: Mauri Pole, MD;  Location: [facility];  Service: Endoscopy;;   POLYPECTOMY     SUBMUCOSAL LIFTING INJECTION  12/17/2020   Procedure: SUBMUCOSAL LIFTING INJECTION;  Surgeon: Mauri Pole, MD;  Location: [facility];  Service: Endoscopy;;   TEMPOROMANDIBULAR JOINT SURGERY       Social History:  The patient  reports that she quit smoking about 47 years ago. Her smoking use included cigarettes. She has a 5.00 pack-year smoking history. She has been exposed to tobacco smoke. She has never used smokeless tobacco. She reports that she does not currently use alcohol after a past usage of about 5.0 standard drinks of alcohol per week. She reports that she does not use drugs.   Family History:  The patient's family history includes Colon polyps in her brother; Crohn's disease in her child; Diverticulitis in her brother and mother; Heart disease in her mother.    ROS:  Please see the  history of present illness. All other systems are reviewed and  Negative to the above problem except as noted.    PHYSICAL EXAM: VS:  There were no vitals taken for this visit.  GEN: Overweigh 71 yo  , in no acute distress  HEENT: normal  Neck: no JVD, carotid bruits Cardiac: RRR; no murmurrs  no edema  Respiratory:  clear to auscultation bilaterally,  GI: soft, nontender, nondistended, + BS  No hepatomegaly  MS: no deformity Moving all extremities   Skin: warm and dry, no rash Neuro:  Strength and sensation are intact Psych: euthymic mood, full affect   EKG:  EKG is not ordered today.   Lipid Panel    Component Value Date/Time   CHOL 128 12/16/2021 1033   CHOL 138 03/24/2021 0911   TRIG 147.0 12/16/2021 1033   HDL 49.30 12/16/2021 1033   HDL 50 03/24/2021 0911   CHOLHDL 3 12/16/2021 1033   VLDL 29.4 12/16/2021 1033   LDLCALC  50 12/16/2021 1033   LDLCALC 63 03/24/2021 0911      Wt Readings from Last 3 Encounters:  03/04/22 200 lb (90.7 kg)  01/25/22 198 lb 12.8 oz (90.2 kg)  01/18/22 198 lb 12.8 oz (90.2 kg)      ASSESSMENT AND PLAN:  1  Atherosclerosis   Pt remains asymptomatic   Risk factor modify  2  Palpitations   Sound like isolated ectopy  3  Fatigue    I am not convinced cardiac   has orthopedic issures  4  HL   Will start Crestor 10 mg   Follow up lipdis and AST in 8 wks       F/U July      Current medicines are reviewed at length with the patient today.  The patient does not have concerns regarding medicines.  Signed, Dorris Carnes, MD  03/06/2022 10:21 PM    [address], [place], [place]  [address] Phone: [phone]; Fax: [phone]

## 2022-03-07 LAB — ANA, IFA COMPREHENSIVE PANEL
Anti Nuclear Antibody (ANA): POSITIVE — AB
ENA SM Ab Ser-aCnc: <1 AI
SM/RNP: <1 AI
SSA (Ro) (ENA) Antibody, IgG: <1 AI
SSB (La) (ENA) Antibody, IgG: <1 AI
Scleroderma (Scl-70) (ENA) Antibody, IgG: <1 AI
ds DNA Ab: 1 IU/mL

## 2022-03-07 LAB — ANTI-NUCLEAR AB-TITER (ANA TITER): ANA Titer 1: 1:40 {titer} — ABNORMAL HIGH

## 2022-03-07 LAB — RHEUMATOID FACTOR: Rheumatoid fact SerPl-aCnc: <14 IU/mL (ref <14)

## 2022-03-08 ENCOUNTER — Encounter: Payer: Self-pay | Admitting: Internal Medicine

## 2022-03-08 ENCOUNTER — Ambulatory Visit (INDEPENDENT_AMBULATORY_CARE_PROVIDER_SITE_OTHER): Payer: Medicare Other

## 2022-03-08 ENCOUNTER — Ambulatory Visit: Payer: Medicare Other | Admitting: Internal Medicine

## 2022-03-08 VITALS — BP 152/78 | HR 115 | Temp 98.5°F | Resp 16 | Ht 67.0 in

## 2022-03-08 DIAGNOSIS — R051 Acute cough: Secondary | ICD-10-CM | POA: Insufficient documentation

## 2022-03-08 DIAGNOSIS — U071 COVID-19: Secondary | ICD-10-CM | POA: Diagnosis not present

## 2022-03-08 LAB — POC COVID19 BINAXNOW: SARS Coronavirus 2 Ag: POSITIVE — AB

## 2022-03-08 MED ORDER — NIRMATRELVIR/RITONAVIR (PAXLOVID) TABLET (RENAL DOSING): 2.0000 | ORAL_TABLET | Freq: Two times a day (BID) | ORAL | 0 refills | Status: AC

## 2022-03-08 MED ORDER — PROMETHAZINE-DM 6.25-15 MG/5ML PO SYRP: 5.0000 mL | ORAL_SOLUTION | Freq: Four times a day (QID) | ORAL | 0 refills | Status: AC | PRN

## 2022-03-08 NOTE — Patient Instructions (Signed)

## 2022-03-08 NOTE — Progress Notes (Unsigned)
Subjective:  Patient ID: Patricia Chambers, female    DOB: 18-Feb-1951  Age: 71 y.o. MRN: [id number]  CC: Cough   HPI [name] presents for a 3-day history of nonproductive cough, muscle aches, sore throat, postnasal drip, temperature to 100.1, and chills.  He  Outpatient Medications Prior to Visit  Medication Sig Dispense Refill   acetaminophen (TYLENOL) 500 MG tablet Take 500 mg by mouth as needed.     Carboxymethylcellulose Sodium (REFRESH TEARS OP) Apply to eye in the morning and at bedtime. TAKE 2 DROPS TWICE A DAY     EPINEPHrine 0.3 mg/0.3 mL IJ SOAJ injection Inject 0.3 mg into the muscle as needed for anaphylaxis. 1 each 2   fluticasone (FLONASE) 50 MCG/ACT nasal spray Place 2 sprays into both nostrils daily as needed for allergies or rhinitis.     levothyroxine (SYNTHROID) 25 MCG tablet Take 1 tablet (25 mcg total) by mouth daily before breakfast. 90 tablet 3   Multiple Vitamins-Minerals (MULTIVITAMIN WITH MINERALS) tablet Take 1 tablet by mouth daily.     ondansetron (ZOFRAN) 4 MG tablet Take 1 tablet (4 mg total) by mouth every 8 (eight) hours as needed for nausea or vomiting. 10 tablet 0   rosuvastatin (CRESTOR) 10 MG tablet Take 1 tablet (10 mg total) by mouth daily. 90 tablet 3   No facility-administered medications prior to visit.    ROS Review of Systems  Constitutional:  Positive for chills, fatigue and fever. Negative for diaphoresis.  HENT:  Positive for sore throat. Negative for trouble swallowing.   Respiratory:  Positive for cough. Negative for chest tightness, shortness of breath and wheezing.   Cardiovascular:  Negative for chest pain, palpitations and leg swelling.  Gastrointestinal:  Positive for diarrhea and nausea. Negative for abdominal pain and vomiting.  Genitourinary: Negative.  Negative for difficulty urinating.  Musculoskeletal:  Positive for myalgias.  Skin:  Negative for rash.  Neurological: Negative.   Hematological:  Negative for adenopathy.  Does not bruise/bleed easily.  Psychiatric/Behavioral: Negative.      Objective:  BP (!) 152/78   Pulse (!) 115   Temp 98.5 F (36.9 C) (Oral)   Resp 16   Ht '5\' 7"'$  (1.702 m)   SpO2 97%   BMI 31.32 kg/m   BP Readings from Last 3 Encounters:  03/08/22 (!) 152/78  03/04/22 120/76  01/25/22 139/77    Wt Readings from Last 3 Encounters:  03/04/22 200 lb (90.7 kg)  01/25/22 198 lb 12.8 oz (90.2 kg)  01/18/22 198 lb 12.8 oz (90.2 kg)    Physical Exam Vitals reviewed.  Constitutional:      General: She is not in acute distress.    Appearance: She is not toxic-appearing or diaphoretic.  HENT:     Mouth/Throat:     Mouth: Mucous membranes are moist.  Eyes:     General: No scleral icterus.    Conjunctiva/sclera: Conjunctivae normal.  Cardiovascular:     Rate and Rhythm: Normal rate and regular rhythm.     Heart sounds: No murmur heard. Pulmonary:     Effort: Pulmonary effort is normal.     Breath sounds: No stridor. No wheezing, rhonchi or rales.  Abdominal:     General: Abdomen is flat.     Palpations: There is no mass.     Tenderness: There is no abdominal tenderness. There is no guarding.     Hernia: No hernia is present.  Musculoskeletal:        General: Normal  range of motion.     Cervical back: Neck supple.     Right lower leg: No edema.     Left lower leg: No edema.  Lymphadenopathy:     Cervical: No cervical adenopathy.  Skin:    General: Skin is warm and dry.     Findings: No rash.  Neurological:     General: No focal deficit present.     Mental Status: She is alert. Mental status is at baseline.  Psychiatric:        Mood and Affect: Mood normal.        Behavior: Behavior normal.     Lab Results  Component Value Date   WBC 5.6 12/16/2021   HGB 13.8 12/16/2021   HCT 40.1 12/16/2021   PLT 183.0 12/16/2021   GLUCOSE 110 (H) 12/16/2021   CHOL 128 12/16/2021   TRIG 147.0 12/16/2021   HDL 49.30 12/16/2021   LDLCALC 50 12/16/2021   ALT 21  12/16/2021   AST 19 12/16/2021   NA 138 12/16/2021   K 4.3 12/16/2021   CL 104 12/16/2021   CREATININE 1.03 12/16/2021   BUN 16 12/16/2021   CO2 28 12/16/2021   TSH 1.94 12/16/2021   INR 1.01 06/24/2013   HGBA1C 5.8 12/21/2021    DG Chest 2 View  Result Date: 03/08/2022 CLINICAL DATA:  Cough, shortness of breath, and fatigue. COVID positive. EXAM: CHEST - 2 VIEW COMPARISON:  Chest x-ray dated November 07, 2018. FINDINGS: The heart size and mediastinal contours are within normal limits. Normal pulmonary vascularity. Unchanged minimal linear scarring in the lingula. No focal consolidation, pleural effusion, or pneumothorax. No acute osseous abnormality. IMPRESSION: 1. No acute cardiopulmonary disease. Electronically Signed   By: Titus Dubin M.D.   On: 03/08/2022 13:35     Assessment & Plan:   Siyana was seen today for cough.  Diagnoses and all orders for this visit:  COVID -     POC COVID-19 -     nirmatrelvir/ritonavir EUA, renal dosing, (PAXLOVID) 10 x 150 MG & 10 x '100MG'$  TABS; Take 2 tablets by mouth 2 (two) times daily for 5 days. (Take nirmatrelvir 150 mg one tablet twice daily for 5 days and ritonavir 100 mg one tablet twice daily for 5 days) Patient GFR is 56 -     promethazine-dextromethorphan (PROMETHAZINE-DM) 6.25-15 MG/5ML syrup; Take 5 mLs by mouth 4 (four) times daily as needed for up to 7 days for cough.  Acute cough- Chest x-ray is normal.  COVID is positive.  Will treat with an antiviral and offer symptom relief. -     DG Chest 2 View; Future -     promethazine-dextromethorphan (PROMETHAZINE-DM) 6.25-15 MG/5ML syrup; Take 5 mLs by mouth 4 (four) times daily as needed for up to 7 days for cough.   I am having Sivan Castrillon "Dee-nee" start on nirmatrelvir/ritonavir EUA (renal dosing) and promethazine-dextromethorphan. I am also having her maintain her fluticasone, levothyroxine, multivitamin with minerals, EPINEPHrine, rosuvastatin, ondansetron, acetaminophen, and  Carboxymethylcellulose Sodium (REFRESH TEARS OP).  Meds ordered this encounter  Medications   nirmatrelvir/ritonavir EUA, renal dosing, (PAXLOVID) 10 x 150 MG & 10 x '100MG'$  TABS    Sig: Take 2 tablets by mouth 2 (two) times daily for 5 days. (Take nirmatrelvir 150 mg one tablet twice daily for 5 days and ritonavir 100 mg one tablet twice daily for 5 days) Patient GFR is 56    Dispense:  20 tablet    Refill:  0  promethazine-dextromethorphan (PROMETHAZINE-DM) 6.25-15 MG/5ML syrup    Sig: Take 5 mLs by mouth 4 (four) times daily as needed for up to 7 days for cough.    Dispense:  118 mL    Refill:  0     Follow-up: Return in about 3 weeks (around 03/29/2022).  Scarlette Calico, MD

## 2022-03-09 ENCOUNTER — Ambulatory Visit: Payer: Medicare Other | Admitting: Internal Medicine

## 2022-03-11 ENCOUNTER — Ambulatory Visit: Payer: Medicare Other | Admitting: Physician Assistant

## 2022-03-29 ENCOUNTER — Encounter: Payer: Self-pay | Admitting: Internal Medicine

## 2022-03-29 ENCOUNTER — Ambulatory Visit: Payer: Medicare Other | Admitting: Internal Medicine

## 2022-03-29 VITALS — BP 160/82 | HR 87 | Temp 98.5°F | Ht 67.0 in | Wt 196.0 lb

## 2022-03-29 DIAGNOSIS — U071 COVID-19: Secondary | ICD-10-CM

## 2022-03-29 DIAGNOSIS — H04123 Dry eye syndrome of bilateral lacrimal glands: Secondary | ICD-10-CM | POA: Diagnosis not present

## 2022-03-29 NOTE — Progress Notes (Addendum)
   Subjective:   Patient ID: Patricia Chambers, female    DOB: Jan 17, 1951, 73 y.o.   MRN: [id number]  HPI The patient is a 72 YO female coming in for concerns.  Review of Systems  Constitutional:  Positive for fatigue.  HENT: Negative.         Altered taste/smell  Eyes: Negative.   Respiratory:  Negative for cough, chest tightness and shortness of breath.   Cardiovascular:  Negative for chest pain, palpitations and leg swelling.  Gastrointestinal:  Negative for abdominal distention, abdominal pain, constipation, diarrhea, nausea and vomiting.  Musculoskeletal: Negative.   Skin: Negative.   Neurological: Negative.   Psychiatric/Behavioral: Negative.      Objective:  Physical Exam Constitutional:      Appearance: She is well-developed.  HENT:     Head: Normocephalic and atraumatic.  Cardiovascular:     Rate and Rhythm: Normal rate and regular rhythm.  Pulmonary:     Effort: Pulmonary effort is normal. No respiratory distress.     Breath sounds: Normal breath sounds. No wheezing or rales.  Abdominal:     General: Bowel sounds are normal. There is no distension.     Palpations: Abdomen is soft.     Tenderness: There is no abdominal tenderness. There is no rebound.  Musculoskeletal:     Cervical back: Normal range of motion.  Skin:    General: Skin is warm and dry.  Neurological:     Mental Status: She is alert and oriented to person, place, and time.     Coordination: Coordination normal.     Vitals:   03/29/22 1031 03/29/22 1040  BP: (!) 160/82 (!) 160/82  Pulse: 87   Temp: 98.5 F (36.9 C)   TempSrc: Oral   SpO2: 97%   Weight: 196 lb (88.9 kg)   Height: '5\' 7"'$  (1.702 m)     Assessment & Plan:  Visit time 25 minutes in face to face communication with patient and coordination of care, additional 5 minutes spent in record review, coordination or care, ordering tests, communicating/referring to other healthcare professionals, documenting in medical records all on the  same day of the visit for total time 30 minutes spent on the visit.

## 2022-03-29 NOTE — Assessment & Plan Note (Signed)
We discussed in full detail her ANA and panel and CK and RF today. Likely false positive ANA. Will plan to repeat in 6 months.

## 2022-03-29 NOTE — Assessment & Plan Note (Signed)
Resolved but persistent fatigue and altered taste/smell. Given she has already had improvement likely will gradually return to normal over 3-6 months.

## 2022-03-29 NOTE — Patient Instructions (Signed)
The labs are normal

## 2022-03-30 ENCOUNTER — Ambulatory Visit
Admission: RE | Admit: 2022-03-30 | Discharge: 2022-03-30 | Disposition: A | Discharge status: Home / Self Care | Payer: Medicare Other | Source: Ambulatory Visit | Attending: Internal Medicine | Admitting: Internal Medicine

## 2022-03-30 DIAGNOSIS — Z1231 Encounter for screening mammogram for malignant neoplasm of breast: Secondary | ICD-10-CM

## 2022-04-17 NOTE — Progress Notes (Signed)
Cardiology Office Note   Date:  04/18/2022   ID:  Harlowe Dowler, DOB Jan 31, 1951, MRN [id number]  PCP:  Hoyt Koch, MD  Cardiologist:   Dorris Carnes, MD   Pt presents for f/u of CP      History of Present Illness: Patricia Chambers is a 72 y.o. female with a history of CP   I saw her in 2015 for CP  She had a myoview scan which showed scar with mild periinfarct ischemia   This lead to [facility] which was normal    Carotid USN   minimal plaquing    I last saw the pt in OCt 2022  Since seen she noted occasional R sided CP More in R abdomen    Not associated with activity   Breathing is OK    The pt had an allergic Rxn to something TTreated with steroid dose pack   Says she never felt better   She is recovering from [place] (Dec 2023)   She says her BP has been a little higher since infection.  Her eyes have been bothering her   Seen by ophthy for dry eyes   L eye painful  Felt possibly autoimmune  ANA positive though low titer   CK now 251   Referred to rheumatology     Current Meds  Medication Sig   acetaminophen (TYLENOL) 500 MG tablet Take 500 mg by mouth as needed.   Carboxymethylcellul-Glycerin (REFRESH OPTIVE) 1-0.9 % GEL    Carboxymethylcellulose Sodium (REFRESH TEARS OP) Apply to eye in the morning and at bedtime. TAKE 2 DROPS TWICE A DAY   EPINEPHrine 0.3 mg/0.3 mL IJ SOAJ injection Inject 0.3 mg into the muscle as needed for anaphylaxis.   fluticasone (FLONASE) 50 MCG/ACT nasal spray Place 2 sprays into both nostrils daily as needed for allergies or rhinitis.   levothyroxine (SYNTHROID) 25 MCG tablet Take 1 tablet (25 mcg total) by mouth daily before breakfast.   Multiple Vitamins-Minerals (MULTIVITAMIN WITH MINERALS) tablet Take 1 tablet by mouth daily.   Omega-3 Fatty Acids (FISH OIL PO) Take 2 tablets by mouth daily.   ondansetron (ZOFRAN) 4 MG tablet Take 1 tablet (4 mg total) by mouth every 8 (eight) hours as needed for nausea or vomiting.   rosuvastatin (CRESTOR) 10 MG  tablet Take 1 tablet (10 mg total) by mouth daily.     Allergies:   Naproxen and Penicillins   Past Medical History:  Diagnosis Date   Allergy    Arthritis    Asthma    Cataract    Colon polyp    patient states she had 1 removed 20 years ago   Essential hypertension 06/21/2013   FHx: migraine headaches    Hx of colonic polyps    Hyperlipidemia    Hypertension    Sarcoidosis    Thyroid disease    Wears hearing aid    BILATERAL    Past Surgical History:  Procedure Laterality Date   BROKEN ANKLE Right    BOOT   COLONOSCOPY     COLONOSCOPY WITH PROPOFOL N/A 12/17/2020   Procedure: COLONOSCOPY WITH PROPOFOL;  Surgeon: Mauri Pole, MD;  Location: [facility];  Service: Endoscopy;  Laterality: N/A;   ENDOSCOPIC MUCOSAL RESECTION N/A 12/17/2020   Procedure: ENDOSCOPIC MUCOSAL RESECTION;  Surgeon: Mauri Pole, MD;  Location: [facility];  Service: Endoscopy;  Laterality: N/A;   HAMSTRING TEAR Left    LEFT HEART CATHETERIZATION WITH CORONARY ANGIOGRAM N/A 06/24/2013   Procedure:  LEFT HEART CATHETERIZATION WITH CORONARY ANGIOGRAM;  Surgeon: Blane Ohara, MD;  Location: [facility] CATH LAB;  Service: Cardiovascular;  Laterality: N/A;   POLYPECTOMY  12/17/2020   Procedure: POLYPECTOMY;  Surgeon: Mauri Pole, MD;  Location: [facility];  Service: Endoscopy;;   POLYPECTOMY     SUBMUCOSAL LIFTING INJECTION  12/17/2020   Procedure: SUBMUCOSAL LIFTING INJECTION;  Surgeon: Mauri Pole, MD;  Location: [facility];  Service: Endoscopy;;   TEMPOROMANDIBULAR JOINT SURGERY       Social History:  The patient  reports that she quit smoking about 48 years ago. Her smoking use included cigarettes. She has a 5.00 pack-year smoking history. She has been exposed to tobacco smoke. She has never used smokeless tobacco. She reports that she does not currently use alcohol after a past usage of about 5.0 standard drinks of alcohol per week. She reports that she does not use  drugs.   Family History:  The patient's family history includes Colon polyps in her brother; Crohn's disease in her child; Diverticulitis in her brother and mother; Heart disease in her mother.    ROS:  Please see the history of present illness. All other systems are reviewed and  Negative to the above problem except as noted.    PHYSICAL EXAM: VS:  BP 136/88   Pulse 96   Ht '5\' 7"'$  (1.702 m)   Wt 199 lb 12.8 oz (90.6 kg)   SpO2 98%   BMI 31.29 kg/m   GEN: Obese  72 yo  , in no acute distress  HEENT: normal  Neck: no JVD, carotid bruit Cardiac: RRR; no murmurr  no LE edema  Respiratory:  clear to auscultation bilaterally,  GI: soft, nontender, nondistended, + BS  No hepatomegaly  MS: no deformity Moving all extremities   Skin: warm and dry, no rash Neuro:  Strength and sensation are intact Psych: euthymic mood, full affect   EKG:  EKG is ordered today.  NSR   LVH  nonspecific ST changes      Lipid Panel    Component Value Date/Time   CHOL 128 12/16/2021 1033   CHOL 138 03/24/2021 0911   TRIG 147.0 12/16/2021 1033   HDL 49.30 12/16/2021 1033   HDL 50 03/24/2021 0911   CHOLHDL 3 12/16/2021 1033   VLDL 29.4 12/16/2021 1033   LDLCALC 50 12/16/2021 1033   LDLCALC 63 03/24/2021 0911      Wt Readings from Last 3 Encounters:  04/18/22 199 lb 12.8 oz (90.6 kg)  03/29/22 196 lb (88.9 kg)  03/04/22 200 lb (90.7 kg)      ASSESSMENT AND PLAN:  1  Atherosclerosis   Mild  no symptoms of angina   I would recomm she hold the  statin with CK elevation  Follow for now    Recheck later this spring   2  Palpitations   Denies    3  HL  Crestor was started a couple years ago   Hold and recheck CK and ANA  4  Rheum   Pt with eye findings and lab work sugg possible autoimmune problem  Stop statin   Recheck labs in a couple months     Follow up next fall     Current medicines are reviewed at length with the patient today.  The patient does not have concerns regarding  medicines.  Signed, Dorris Carnes, MD  04/18/2022 1:36 PM    [place], [place]  [address] Phone: [phone]; Fax: [phone]

## 2022-04-18 ENCOUNTER — Ambulatory Visit: Payer: Medicare Other | Attending: Internal Medicine | Admitting: Internal Medicine

## 2022-04-18 ENCOUNTER — Encounter: Payer: Self-pay | Admitting: Internal Medicine

## 2022-04-18 VITALS — BP 136/88 | HR 96 | Ht 67.0 in | Wt 199.8 lb

## 2022-04-18 DIAGNOSIS — R748 Abnormal levels of other serum enzymes: Secondary | ICD-10-CM | POA: Diagnosis not present

## 2022-04-18 DIAGNOSIS — T50905A Adverse effect of unspecified drugs, medicaments and biological substances, initial encounter: Secondary | ICD-10-CM | POA: Diagnosis not present

## 2022-04-18 NOTE — Patient Instructions (Addendum)
Medication Instructions:  STOP CRESTOR  *If you need a refill on your cardiac medications before your next appointment, please call your pharmacy*   Lab Work: END OF MARCH CK AND ANA  If you have labs (blood work) drawn today and your tests are completely normal, you will receive your results only by: [place] (if you have MyChart) OR A paper copy in the mail If you have any lab test that is abnormal or we need to change your treatment, we will call you to review the results.   Testing/Procedures:    Follow-Up: At [facility], you and your health needs are our priority.  As part of our continuing mission to provide you with exceptional heart care, we have created designated Provider Care Teams.  These Care Teams include your primary Cardiologist (physician) and Advanced Practice Providers (APPs -  Physician Assistants and Nurse Practitioners) who all work together to provide you with the care you need, when you need it.  We recommend signing up for the patient portal called "MyChart".  Sign up information is provided on this After Visit Summary.  MyChart is used to connect with patients for Virtual Visits (Telemedicine).  Patients are able to view lab/test results, encounter notes, upcoming appointments, etc.  Non-urgent messages can be sent to your provider as well.   To learn more about what you can do with MyChart, go to [email or web address].    Your next appointment:   1 year(s)  DR PAULA ROSS     Other Instructions REFERRAL TO RHEUMATOLOGY

## 2022-04-19 ENCOUNTER — Ambulatory Visit: Payer: Medicare Other

## 2022-04-19 VITALS — Ht 67.0 in | Wt 199.0 lb

## 2022-04-19 NOTE — Progress Notes (Addendum)
Subjective:   Patricia Chambers is a 72 y.o. female who presents for Medicare Annual (Subsequent) preventive examination.  Review of Systems    Virtual Visit via Telephone Note  I connected with  Patricia Chambers on 04/19/22 at  1:00 PM EST by telephone and verified that I am speaking with the correct person using two identifiers.  Location: Patient: Home Provider: Office Persons participating in the virtual visit: patient/Nurse Health Advisor   I discussed the limitations, risks, security and privacy concerns of performing an evaluation and management service by telephone and the availability of in person appointments. The patient expressed understanding and agreed to proceed.  Interactive audio and video telecommunications were attempted between this nurse and patient, however failed, due to patient having technical difficulties OR patient did not have access to video capability.  We continued and completed visit with audio only.  Some vital signs may be absent or patient reported.   Criselda Peaches, LPN  Cardiac Risk Factors include: advanced age (>28mn, >>26women);dyslipidemia     Objective:    Today's Vitals   04/19/22 1300  Weight: 199 lb (90.3 kg)  Height: '5\' 7"'$  (1.702 m)   Body mass index is 31.17 kg/m.     04/19/2022    1:10 PM 12/02/2021    9:51 AM 09/03/2021   10:28 AM 12/17/2020    9:41 AM 06/16/2020    5:43 PM 08/19/2014   12:30 PM 06/24/2013   11:20 AM  Advanced Directives  Does Patient Have a Medical Advance Directive? Yes Yes Yes Yes No Yes Patient does not have advance directive  Type of Advance Directive HGuyLiving will HBayardLiving will Living will;Healthcare Power of AAtascosaLiving will     Does patient want to make changes to medical advance directive?      No - Patient declined   Copy of HEnterprisein Chart? No - copy requested No - copy requested  No - copy  requested  No - copy requested   Would patient like information on creating a medical advance directive?     No - Patient declined      Current Medications (verified) Outpatient Encounter Medications as of 04/19/2022  Medication Sig   acetaminophen (TYLENOL) 500 MG tablet Take 500 mg by mouth as needed.   Carboxymethylcellul-Glycerin (REFRESH OPTIVE) 1-0.9 % GEL    Carboxymethylcellulose Sodium (REFRESH TEARS OP) Apply to eye in the morning and at bedtime. TAKE 2 DROPS TWICE A DAY   EPINEPHrine 0.3 mg/0.3 mL IJ SOAJ injection Inject 0.3 mg into the muscle as needed for anaphylaxis.   fluticasone (FLONASE) 50 MCG/ACT nasal spray Place 2 sprays into both nostrils daily as needed for allergies or rhinitis.   levothyroxine (SYNTHROID) 25 MCG tablet Take 1 tablet (25 mcg total) by mouth daily before breakfast.   Multiple Vitamins-Minerals (MULTIVITAMIN WITH MINERALS) tablet Take 1 tablet by mouth daily.   Omega-3 Fatty Acids (FISH OIL PO) Take 2 tablets by mouth daily.   ondansetron (ZOFRAN) 4 MG tablet Take 1 tablet (4 mg total) by mouth every 8 (eight) hours as needed for nausea or vomiting.   No facility-administered encounter medications on file as of 04/19/2022.    Allergies (verified) Naproxen and Penicillins   History: Past Medical History:  Diagnosis Date   Allergy    Arthritis    Asthma    Cataract    Colon polyp    patient states she had  1 removed 20 years ago   Essential hypertension 06/21/2013   FHx: migraine headaches    Hx of colonic polyps    Hyperlipidemia    Hypertension    Sarcoidosis    Thyroid disease    Wears hearing aid    BILATERAL   Past Surgical History:  Procedure Laterality Date   BROKEN ANKLE Right    BOOT   COLONOSCOPY     COLONOSCOPY WITH PROPOFOL N/A 12/17/2020   Procedure: COLONOSCOPY WITH PROPOFOL;  Surgeon: Mauri Pole, MD;  Location: [facility];  Service: Endoscopy;  Laterality: N/A;   ENDOSCOPIC MUCOSAL RESECTION N/A 12/17/2020    Procedure: ENDOSCOPIC MUCOSAL RESECTION;  Surgeon: Mauri Pole, MD;  Location: [facility];  Service: Endoscopy;  Laterality: N/A;   HAMSTRING TEAR Left    LEFT HEART CATHETERIZATION WITH CORONARY ANGIOGRAM N/A 06/24/2013   Procedure: LEFT HEART CATHETERIZATION WITH CORONARY ANGIOGRAM;  Surgeon: Blane Ohara, MD;  Location: [facility] CATH LAB;  Service: Cardiovascular;  Laterality: N/A;   POLYPECTOMY  12/17/2020   Procedure: POLYPECTOMY;  Surgeon: Mauri Pole, MD;  Location: [facility];  Service: Endoscopy;;   POLYPECTOMY     SUBMUCOSAL LIFTING INJECTION  12/17/2020   Procedure: SUBMUCOSAL LIFTING INJECTION;  Surgeon: Mauri Pole, MD;  Location: [facility];  Service: Endoscopy;;   TEMPOROMANDIBULAR JOINT SURGERY     Family History  Problem Relation Age of Onset   Diverticulitis Mother    Heart disease Mother    Colon polyps Brother    Diverticulitis Brother    Crohn's disease Child    Colon cancer Neg Hx    Pancreatic cancer Neg Hx    Esophageal cancer Neg Hx    Rectal cancer Neg Hx    Stomach cancer Neg Hx    Ulcerative colitis Neg Hx    Breast cancer Neg Hx    Social History   Socioeconomic History   Marital status: Widowed    Spouse name: Not on file   Number of children: Not on file   Years of education: Not on file   Highest education level: Not on file  Occupational History   Not on file  Tobacco Use   Smoking status: Former    Packs/day: 1.00    Years: 5.00    Total pack years: 5.00    Types: Cigarettes    Quit date: 03/28/1974    Years since quitting: 48.0    Passive exposure: Past (PARENTS SMOKED)   Smokeless tobacco: Never  Vaping Use   Vaping Use: Never used  Substance and Sexual Activity   Alcohol use: Not Currently    Alcohol/week: 5.0 standard drinks of alcohol    Types: 5 Glasses of wine per week    Comment: Or more   Drug use: No   Sexual activity: Not Currently  Other Topics Concern   Not on file  Social History  Narrative   Not on file   Social Determinants of Health   Financial Resource Strain: Low Risk  (04/19/2022)   Overall Financial Resource Strain (CARDIA)    Difficulty of Paying Living Expenses: Not hard at all  Food Insecurity: No Food Insecurity (04/19/2022)   Hunger Vital Sign    Worried About Running Out of Food in the Last Year: Never true    Ran Out of Food in the Last Year: Never true  Transportation Needs: No Transportation Needs (04/19/2022)   PRAPARE - Hydrologist (Medical): No  Lack of Transportation (Non-Medical): No  Physical Activity: Sufficiently Active (04/19/2022)   Exercise Vital Sign    Days of Exercise per Week: 5 days    Minutes of Exercise per Session: 30 min  Stress: No Stress Concern Present (04/19/2022)   [place]    Feeling of Stress : Not at all  Social Connections: Socially Isolated (04/19/2022)   Social Connection and Isolation Panel [NHANES]    Frequency of Communication with Friends and Family: More than three times a week    Frequency of Social Gatherings with Friends and Family: More than three times a week    Attends Religious Services: Never    Marine scientist or Organizations: No    Attends Archivist Meetings: Never    Marital Status: Widowed    Tobacco Counseling Counseling given: Not Answered   Clinical Intake:  Pre-visit preparation completed: Yes  Pain : No/denies pain     BMI - recorded: 31.17 Nutritional Status: BMI > 30  Obese Nutritional Risks: None Diabetes: No  How often do you need to have someone help you when you read instructions, pamphlets, or other written materials from your doctor or pharmacy?: 2 - Rarely  Diabetic?  No  Interpreter Needed?: No  Information entered by :: Rolene Arbour LPN   Activities of Daily Living    04/19/2022    1:06 PM 04/18/2022    3:30 PM  In your present state of  health, do you have any difficulty performing the following activities:  Hearing? 1 1  Comment Wears hearing aids   Vision? 0 0  Difficulty concentrating or making decisions? 0 1  Walking or climbing stairs? 0   Dressing or bathing? 0 0  Doing errands, shopping? 0 0  Preparing Food and eating ? N N  Using the Toilet? N N  In the past six months, have you accidently leaked urine? N Y  Do you have problems with loss of bowel control? N N  Managing your Medications? N N  Managing your Finances? N N  Housekeeping or managing your Housekeeping? N     Patient Care Team: Hoyt Koch, MD as PCP - General (Internal Medicine)  Indicate any recent Medical Services you may have received from other than [facility] providers in the past year (date may be approximate).     Assessment:   This is a routine wellness examination for Patricia Chambers.  Hearing/Vision screen Hearing Screening - Comments:: Wears hearing aids Vision Screening - Comments:: Wears rx glasses - up to date with routine eye exams with  Dr Bing Plume  Dietary issues and exercise activities discussed: Exercise limited by: None identified   Goals Addressed               This Visit's Progress     Patient stated (pt-stated)        Increase to a healthy diet and lose weight, also resume exercise.       Depression Screen    04/19/2022    1:05 PM 03/04/2022    3:26 PM 12/21/2021    9:20 AM 04/26/2021    8:31 AM  PHQ 2/9 Scores  PHQ - 2 Score 0 0 0 0  PHQ- 9 Score 0 0 0     Fall Risk    04/19/2022    1:07 PM 04/18/2022    3:30 PM 03/04/2022    3:26 PM 12/21/2021    9:20 AM  Fall Risk  Falls in the past year? 0 0 0 0  Number falls in past yr: 0  0 0  Injury with Fall? 0  0 0  Risk for fall due to : No Fall Risks  No Fall Risks   Follow up Falls prevention discussed  Falls evaluation completed     [place]:  Any stairs in or around the home? Yes  If so, are there any without  handrails? No  Home free of loose throw rugs in walkways, pet beds, electrical cords, etc? Yes  Adequate lighting in your home to reduce risk of falls? Yes   ASSISTIVE DEVICES UTILIZED TO PREVENT FALLS:  Life alert? No  Use of a cane, walker or w/c? No  Grab bars in the bathroom? No  Shower chair or bench in shower? No Elevated toilet seat or a handicapped toilet? No   TIMED UP AND GO:  Was the test performed? No . Audio Visit   Cognitive Function:        04/19/2022    1:10 PM  6CIT Screen  What Year? 0 points  What month? 0 points  What time? 0 points  Count back from 20 0 points  Months in reverse 0 points  Repeat phrase 0 points  Total Score 0 points    Immunizations Immunization History  Administered Date(s) Administered   Fluad Quad(high Dose 65+) 11/21/2018, 11/10/2020   Influenza Split 12/29/2011, 12/04/2013   Influenza,inj,Quad PF,6+ Mos 12/15/2015, 01/12/2017, 12/29/2017   Influenza,inj,quad, With Preservative 11/10/2014   Influenza-Unspecified 11/26/2017, 11/26/2021   Moderna Covid-19 Vaccine Bivalent Booster 32yr & up 12/30/2021   PFIZER(Purple Top)SARS-COV-2 Vaccination 05/05/2019, 05/30/2019, 01/03/2020, 10/29/2020, 08/29/2021   Pfizer Covid-19 Vaccine Bivalent Booster 176yr& up 02/24/2021   Pneumococcal Conjugate-13 08/19/2015   Pneumococcal Polysaccharide-23 08/30/2016   Tdap 10/31/2011   Zoster, Live 10/17/2019, 12/18/2019    TDAP status: Due, Education has been provided regarding the importance of this vaccine. Advised may receive this vaccine at local pharmacy or Health Dept. Aware to provide a copy of the vaccination record if obtained from local pharmacy or Health Dept. Verbalized acceptance and understanding.  Flu Vaccine status: Up to date  Pneumococcal vaccine status: Up to date  Covid-19 vaccine status: Completed vaccines  Qualifies for Shingles Vaccine? Yes   Zostavax completed No   Shingrix Completed?: No.    Education has been  provided regarding the importance of this vaccine. Patient has been advised to call insurance company to determine out of pocket expense if they have not yet received this vaccine. Advised may also receive vaccine at local pharmacy or Health Dept. Verbalized acceptance and understanding.  Screening Tests Health Maintenance  Topic Date Due   Hepatitis C Screening  Never done   DTaP/Tdap/Td (2 - Td or Tdap) 10/30/2021   COVID-19 Vaccine (8 - 2023-24 season) 05/05/2022 (Originally 02/24/2022)   Zoster Vaccines- Shingrix (1 of 2) 06/03/2022 (Originally 03/05/2000)   [place] Well[place] ([place] 04/20/2023   MAMMOGRAM  03/30/2024   COLONOSCOPY (Pts 45-4930yrnsurance coverage will need to be confirmed)  01/25/2025   Pneumonia Vaccine 65+89ears old  Completed   INFLUENZA VACCINE  Completed   DEXA SCAN  Completed   HPV VACCINES  Aged Out    Health Maintenance  Health Maintenance Due  Topic Date Due   Hepatitis C Screening  Never done   DTaP/Tdap/Td (2 - Td or Tdap) 10/30/2021    Colorectal cancer screening: Type of screening: Colonoscopy. Completed  01/25/22. Repeat every 3 years  Mammogram status: Completed 03/30/22. Repeat every year  Bone Density status: Completed 04/30/21. Results reflect: Bone density results: OSTEOPOROSIS. Repeat every   years.  Lung Cancer Screening: (Low Dose CT Chest recommended if Age 53-80 years, 30 pack-year currently smoking OR have quit w/in 15years.) does not qualify.     Additional Screening:  Hepatitis C Screening: does qualify; Completed Deferred  Vision Screening: Recommended annual ophthalmology exams for early detection of glaucoma and other disorders of the eye. Is the patient up to date with their annual eye exam?  Yes  Who is the provider or what is the name of the office in which the patient attends annual eye exams? Dr Bing Plume If pt is not established with a provider, would they like to be referred to a provider to establish care? No .    Dental Screening: Recommended annual dental exams for proper oral hygiene  Community Resource Referral / Chronic Care Management:  CRR required this visit?  No   CCM required this visit?  No      Plan:     I have personally reviewed and noted the following in the patient's chart:   Medical and social history Use of alcohol, tobacco or illicit drugs  Current medications and supplements including opioid prescriptions. Patient is not currently taking opioid prescriptions. Functional ability and status Nutritional status Physical activity Advanced directives List of other physicians Hospitalizations, surgeries, and ER visits in previous 12 months Vitals Screenings to include cognitive, depression, and falls Referrals and appointments  In addition, I have reviewed and discussed with patient certain preventive protocols, quality metrics, and best practice recommendations. A written personalized care plan for preventive services as well as general preventive health recommendations were provided to patient.     Criselda Peaches, LPN   12/11/3844   Nurse Notes:     This encounter was created in error - please disregard.

## 2022-04-19 NOTE — Addendum Note (Signed)
Addended by: Criselda Peaches on: 04/19/2022 03:21 PM   Modules accepted: Orders, Level of Service

## 2022-04-19 NOTE — Patient Instructions (Addendum)
Ms. Patricia Chambers , Thank you for taking time to come for your Medicare Wellness Visit. I appreciate your ongoing commitment to your health goals. Please review the following plan we discussed and let me know if I can assist you in the future.   These are the goals we discussed:  Goals       Patient stated (pt-stated)      Increase to a healthy diet and lose weight, also resume exercise.        This is a list of the screening recommended for you and due dates:  Health Maintenance  Topic Date Due   Hepatitis C Screening: USPSTF Recommendation to screen - Ages 74-79 yo.  Never done   DTaP/Tdap/Td vaccine (2 - Td or Tdap) 10/30/2021   COVID-19 Vaccine (8 - 2023-24 season) 05/05/2022*   Zoster (Shingles) Vaccine (1 of 2) 06/03/2022*   Medicare Annual Wellness Visit  04/20/2023   Mammogram  03/30/2024   Colon Cancer Screening  01/25/2025   Pneumonia Vaccine  Completed   Flu Shot  Completed   DEXA scan (bone density measurement)  Completed   HPV Vaccine  Aged Out  *Topic was postponed. The date shown is not the original due date.    Advanced directives: Please bring a copy of your health care power of attorney and living will to the office to be added to your chart at your convenience.   Conditions/risks identified: None  Next appointment: Follow up in one year for your annual wellness visit     Preventive Care 65 Years and Older, Female Preventive care refers to lifestyle choices and visits with your health care provider that can promote health and wellness. What does preventive care include? A yearly physical exam. This is also called an annual well check. Dental exams once or twice a year. Routine eye exams. Ask your health care provider how often you should have your eyes checked. Personal lifestyle choices, including: Daily care of your teeth and gums. Regular physical activity. Eating a healthy diet. Avoiding tobacco and drug use. Limiting alcohol use. Practicing safe  sex. Taking low-dose aspirin every day. Taking vitamin and mineral supplements as recommended by your health care provider. What happens during an annual well check? The services and screenings done by your health care provider during your annual well check will depend on your age, overall health, lifestyle risk factors, and family history of disease. Counseling  Your health care provider may ask you questions about your: Alcohol use. Tobacco use. Drug use. Emotional well-being. Home and relationship well-being. Sexual activity. Eating habits. History of falls. Memory and ability to understand (cognition). Work and work Statistician. Reproductive health. Screening  You may have the following tests or measurements: Height, weight, and BMI. Blood pressure. Lipid and cholesterol levels. These may be checked every 5 years, or more frequently if you are over 16 years old. Skin check. Lung cancer screening. You may have this screening every year starting at age 82 if you have a 30-pack-year history of smoking and currently smoke or have quit within the past 15 years. Fecal occult blood test (FOBT) of the stool. You may have this test every year starting at age 33. Flexible sigmoidoscopy or colonoscopy. You may have a sigmoidoscopy every 5 years or a colonoscopy every 10 years starting at age 74. Hepatitis C blood test. Hepatitis B blood test. Sexually transmitted disease (STD) testing. Diabetes screening. This is done by checking your blood sugar (glucose) after you have not eaten for a  while (fasting). You may have this done every 1-3 years. Bone density scan. This is done to screen for osteoporosis. You may have this done starting at age 52. Mammogram. This may be done every 1-2 years. Talk to your health care provider about how often you should have regular mammograms. Talk with your health care provider about your test results, treatment options, and if necessary, the need for more  tests. Vaccines  Your health care provider may recommend certain vaccines, such as: Influenza vaccine. This is recommended every year. Tetanus, diphtheria, and acellular pertussis (Tdap, Td) vaccine. You may need a Td booster every 10 years. Zoster vaccine. You may need this after age 72. Pneumococcal 13-valent conjugate (PCV13) vaccine. One dose is recommended after age 34. Pneumococcal polysaccharide (PPSV23) vaccine. One dose is recommended after age 12. Talk to your health care provider about which screenings and vaccines you need and how often you need them. This information is not intended to replace advice given to you by your health care provider. Make sure you discuss any questions you have with your health care provider. Document Released: 04/10/2015 Document Revised: 12/02/2015 Document Reviewed: 01/13/2015 Elsevier Interactive Patient Education  2017 [employer] Prevention in the Home Falls can cause injuries. They can happen to people of all ages. There are many things you can do to make your home safe and to help prevent falls. What can I do on the outside of my home? Regularly fix the edges of walkways and driveways and fix any cracks. Remove anything that might make you trip as you walk through a door, such as a raised step or threshold. Trim any bushes or trees on the path to your home. Use bright outdoor lighting. Clear any walking paths of anything that might make someone trip, such as rocks or tools. Regularly check to see if handrails are loose or broken. Make sure that both sides of any steps have handrails. Any raised decks and porches should have guardrails on the edges. Have any leaves, snow, or ice cleared regularly. Use sand or salt on walking paths during winter. Clean up any spills in your garage right away. This includes oil or grease spills. What can I do in the bathroom? Use night lights. Install grab bars by the toilet and in the tub and shower.  Do not use towel bars as grab bars. Use non-skid mats or decals in the tub or shower. If you need to sit down in the shower, use a plastic, non-slip stool. Keep the floor dry. Clean up any water that spills on the floor as soon as it happens. Remove soap buildup in the tub or shower regularly. Attach bath mats securely with double-sided non-slip rug tape. Do not have throw rugs and other things on the floor that can make you trip. What can I do in the bedroom? Use night lights. Make sure that you have a light by your bed that is easy to reach. Do not use any sheets or blankets that are too big for your bed. They should not hang down onto the floor. Have a firm chair that has side arms. You can use this for support while you get dressed. Do not have throw rugs and other things on the floor that can make you trip. What can I do in the kitchen? Clean up any spills right away. Avoid walking on wet floors. Keep items that you use a lot in easy-to-reach places. If you need to reach something above you,  use a strong step stool that has a grab bar. Keep electrical cords out of the way. Do not use floor polish or wax that makes floors slippery. If you must use wax, use non-skid floor wax. Do not have throw rugs and other things on the floor that can make you trip. What can I do with my stairs? Do not leave any items on the stairs. Make sure that there are handrails on both sides of the stairs and use them. Fix handrails that are broken or loose. Make sure that handrails are as long as the stairways. Check any carpeting to make sure that it is firmly attached to the stairs. Fix any carpet that is loose or worn. Avoid having throw rugs at the top or bottom of the stairs. If you do have throw rugs, attach them to the floor with carpet tape. Make sure that you have a light switch at the top of the stairs and the bottom of the stairs. If you do not have them, ask someone to add them for you. What else  can I do to help prevent falls? Wear shoes that: Do not have high heels. Have rubber bottoms. Are comfortable and fit you well. Are closed at the toe. Do not wear sandals. If you use a stepladder: Make sure that it is fully opened. Do not climb a closed stepladder. Make sure that both sides of the stepladder are locked into place. Ask someone to hold it for you, if possible. Clearly mark and make sure that you can see: Any grab bars or handrails. First and last steps. Where the edge of each step is. Use tools that help you move around (mobility aids) if they are needed. These include: Canes. Walkers. Scooters. Crutches. Turn on the lights when you go into a dark area. Replace any light bulbs as soon as they burn out. Set up your furniture so you have a clear path. Avoid moving your furniture around. If any of your floors are uneven, fix them. If there are any pets around you, be aware of where they are. Review your medicines with your doctor. Some medicines can make you feel dizzy. This can increase your chance of falling. Ask your doctor what other things that you can do to help prevent falls. This information is not intended to replace advice given to you by your health care provider. Make sure you discuss any questions you have with your health care provider. Document Released: 01/08/2009 Document Revised: 08/20/2015 Document Reviewed: 04/18/2014 Elsevier Interactive Patient Education  2017 [employer].

## 2022-05-10 ENCOUNTER — Telehealth: Payer: Self-pay | Admitting: Internal Medicine

## 2022-05-10 NOTE — Telephone Encounter (Signed)
Spoke with Lovey Newcomer with [facility], requesting a copy of the patients labs (ANA and CK). The request was sent through epic and completed on 2/13.

## 2022-05-10 NOTE — Telephone Encounter (Signed)
  Sandy with [facility] calling, she said they received the referral but they also need copy of pt's lab results

## 2022-05-19 ENCOUNTER — Telehealth: Payer: Self-pay

## 2022-05-19 ENCOUNTER — Ambulatory Visit (HOSPITAL_COMMUNITY): Payer: Medicare Other | Attending: Internal Medicine

## 2022-05-19 DIAGNOSIS — R0609 Other forms of dyspnea: Secondary | ICD-10-CM

## 2022-05-19 LAB — ECHOCARDIOGRAM COMPLETE
Area-P 1/2: 2.88 cm2
P 1/2 time: 355 msec
S' Lateral: 3 cm

## 2022-05-19 NOTE — Telephone Encounter (Signed)
Will set up for echo to start Dx SOB      I spoke with the pt and she has been having increased SOB with exertion... her and her son have been walking more for exercise and she has been deconditioned but the SOB has been worse than she thought it should be.   I placed an Echo per Dr Harrington Challenger.

## 2022-06-23 ENCOUNTER — Ambulatory Visit: Payer: Medicare Other | Attending: Internal Medicine

## 2022-06-23 DIAGNOSIS — T50905A Adverse effect of unspecified drugs, medicaments and biological substances, initial encounter: Secondary | ICD-10-CM

## 2022-06-23 DIAGNOSIS — R748 Abnormal levels of other serum enzymes: Secondary | ICD-10-CM

## 2022-06-24 LAB — CK: Total CK: 126 U/L (ref 32–182)

## 2022-06-24 LAB — ANA: Anti Nuclear Antibody (ANA): NEGATIVE

## 2022-06-28 ENCOUNTER — Telehealth: Payer: Self-pay | Admitting: Internal Medicine

## 2022-06-28 DIAGNOSIS — E785 Hyperlipidemia, unspecified: Secondary | ICD-10-CM

## 2022-06-28 NOTE — Telephone Encounter (Signed)
Appt for NMR 06/30/22.

## 2022-06-28 NOTE — Telephone Encounter (Signed)
Patient is requesting lab orders, per recommendation based on recent lab results.

## 2022-06-28 NOTE — Telephone Encounter (Signed)
Pt needs to make a lab appt for Lipids at her convenience.

## 2022-06-29 NOTE — Addendum Note (Signed)
Addended by: Vergia Alcon A on: 06/29/2022 09:03 AM   Modules accepted: Orders

## 2022-06-30 ENCOUNTER — Ambulatory Visit: Payer: Medicare Other | Attending: Internal Medicine

## 2022-06-30 ENCOUNTER — Other Ambulatory Visit (HOSPITAL_COMMUNITY): Payer: Self-pay | Admitting: Radiology

## 2022-06-30 DIAGNOSIS — E785 Hyperlipidemia, unspecified: Secondary | ICD-10-CM

## 2022-06-30 DIAGNOSIS — D869 Sarcoidosis, unspecified: Secondary | ICD-10-CM

## 2022-06-30 DIAGNOSIS — R0600 Dyspnea, unspecified: Secondary | ICD-10-CM

## 2022-07-01 LAB — NMR, LIPOPROFILE
Cholesterol, Total: 240 mg/dL — ABNORMAL HIGH (ref 100–199)
HDL Particle Number: 34.3 umol/L (ref >=30.5)
HDL-C: 52 mg/dL (ref >39)
LDL Particle Number: 1870 nmol/L — ABNORMAL HIGH (ref <1000)
LDL Size: 21 nm (ref >20.5)
LDL-C (NIH Calc): 156 mg/dL — ABNORMAL HIGH (ref 0–99)
LP-IR Score: 56 — ABNORMAL HIGH (ref <=45)
Small LDL Particle Number: 954 nmol/L — ABNORMAL HIGH (ref <=527)
Triglycerides: 177 mg/dL — ABNORMAL HIGH (ref 0–149)

## 2022-07-04 ENCOUNTER — Other Ambulatory Visit: Payer: Self-pay | Admitting: Internal Medicine

## 2022-07-04 DIAGNOSIS — R0609 Other forms of dyspnea: Secondary | ICD-10-CM

## 2022-07-06 ENCOUNTER — Telehealth: Payer: Self-pay | Admitting: Internal Medicine

## 2022-07-06 NOTE — Telephone Encounter (Signed)
Pt advised that Dr Tenny Craw is still reviewing her labs.   She is asking about going back on ASA 81 mg.

## 2022-07-06 NOTE — Telephone Encounter (Signed)
Pt would like a callback regarding Lipid results. Please advise

## 2022-07-13 ENCOUNTER — Other Ambulatory Visit: Payer: Self-pay

## 2022-07-13 MED ORDER — ASPIRIN 81 MG PO TBEC: 81.0000 mg | DELAYED_RELEASE_TABLET | Freq: Every day | ORAL | 3 refills | Status: AC

## 2022-07-13 NOTE — Telephone Encounter (Signed)
My Chart message sent to the pt:     Yes   Go back on ASA

## 2022-07-22 ENCOUNTER — Ambulatory Visit (HOSPITAL_COMMUNITY)
Admission: RE | Admit: 2022-07-22 | Discharge: 2022-07-22 | Disposition: A | Discharge status: Home / Self Care | Payer: Medicare Other | Source: Ambulatory Visit | Attending: Cardiology | Admitting: Cardiology

## 2022-07-22 ENCOUNTER — Ambulatory Visit (HOSPITAL_BASED_OUTPATIENT_CLINIC_OR_DEPARTMENT_OTHER)
Admission: RE | Admit: 2022-07-22 | Discharge: 2022-07-22 | Disposition: A | Discharge status: Home / Self Care | Payer: Medicare Other | Source: Ambulatory Visit | Attending: Cardiology | Admitting: Cardiology

## 2022-07-22 ENCOUNTER — Encounter (HOSPITAL_COMMUNITY): Payer: Self-pay | Admitting: Cardiology

## 2022-07-22 ENCOUNTER — Other Ambulatory Visit (HOSPITAL_COMMUNITY): Payer: Self-pay

## 2022-07-22 ENCOUNTER — Telehealth (HOSPITAL_COMMUNITY): Payer: Self-pay | Admitting: Cardiology

## 2022-07-22 VITALS — BP 160/90 | HR 80 | Wt 193.0 lb

## 2022-07-22 DIAGNOSIS — R0609 Other forms of dyspnea: Secondary | ICD-10-CM | POA: Diagnosis not present

## 2022-07-22 DIAGNOSIS — Z87891 Personal history of nicotine dependence: Secondary | ICD-10-CM | POA: Diagnosis not present

## 2022-07-22 DIAGNOSIS — Z8249 Family history of ischemic heart disease and other diseases of the circulatory system: Secondary | ICD-10-CM | POA: Diagnosis not present

## 2022-07-22 DIAGNOSIS — R9431 Abnormal electrocardiogram [ECG] [EKG]: Secondary | ICD-10-CM | POA: Insufficient documentation

## 2022-07-22 DIAGNOSIS — I351 Nonrheumatic aortic (valve) insufficiency: Secondary | ICD-10-CM | POA: Insufficient documentation

## 2022-07-22 DIAGNOSIS — Z8616 Personal history of COVID-19: Secondary | ICD-10-CM | POA: Insufficient documentation

## 2022-07-22 DIAGNOSIS — I110 Hypertensive heart disease with heart failure: Secondary | ICD-10-CM | POA: Insufficient documentation

## 2022-07-22 DIAGNOSIS — R0789 Other chest pain: Secondary | ICD-10-CM

## 2022-07-22 DIAGNOSIS — I509 Heart failure, unspecified: Secondary | ICD-10-CM

## 2022-07-22 DIAGNOSIS — E039 Hypothyroidism, unspecified: Secondary | ICD-10-CM | POA: Insufficient documentation

## 2022-07-22 DIAGNOSIS — I272 Pulmonary hypertension, unspecified: Secondary | ICD-10-CM | POA: Diagnosis present

## 2022-07-22 DIAGNOSIS — J45909 Unspecified asthma, uncomplicated: Secondary | ICD-10-CM | POA: Diagnosis not present

## 2022-07-22 LAB — ECHOCARDIOGRAM COMPLETE
AR max vel: 3.15 cm2
AV Area VTI: 3.22 cm2
AV Area mean vel: 3.22 cm2
AV Mean grad: 6 mmHg
AV Peak grad: 10.7 mmHg
Ao pk vel: 1.64 m/s
Area-P 1/2: 3.42 cm2
Calc EF: 56.5 %
P 1/2 time: 414 msec
S' Lateral: 3.1 cm
Single Plane A2C EF: 57.9 %
Single Plane A4C EF: 56.2 %

## 2022-07-22 LAB — PULMONARY FUNCTION TEST
DL/VA % pred: 106 %
DL/VA: 4.3 ml/min/mmHg/L
DLCO unc % pred: 100 %
DLCO unc: 21.38 ml/min/mmHg
FEF 25-75 Post: 2.39 L/sec
FEF 25-75 Pre: 2.26 L/sec
FEF2575-%Change-Post: 5 %
FEF2575-%Pred-Post: 120 %
FEF2575-%Pred-Pre: 114 %
FEV1-%Change-Post: 1 %
FEV1-%Pred-Post: 99 %
FEV1-%Pred-Pre: 98 %
FEV1-Post: 2.48 L
FEV1-Pre: 2.45 L
FEV1FVC-%Change-Post: 3 %
FEV1FVC-%Pred-Pre: 103 %
FEV6-%Change-Post: -2 %
FEV6-%Pred-Post: 96 %
FEV6-%Pred-Pre: 98 %
FEV6-Post: 3.03 L
FEV6-Pre: 3.12 L
FEV6FVC-%Pred-Post: 104 %
FEV6FVC-%Pred-Pre: 104 %
FVC-%Change-Post: -2 %
FVC-%Pred-Post: 92 %
FVC-%Pred-Pre: 94 %
FVC-Post: 3.04 L
FVC-Pre: 3.12 L
Post FEV1/FVC ratio: 81 %
Post FEV6/FVC ratio: 100 %
Pre FEV1/FVC ratio: 78 %
Pre FEV6/FVC Ratio: 100 %
RV % pred: 95 %
RV: 2.27 L
TLC % pred: 101 %
TLC: 5.59 L

## 2022-07-22 LAB — BASIC METABOLIC PANEL
Anion gap: 10 (ref 5–15)
BUN: 13 mg/dL (ref 8–23)
CO2: 23 mmol/L (ref 22–32)
Calcium: 9 mg/dL (ref 8.9–10.3)
Chloride: 107 mmol/L (ref 98–111)
Creatinine, Ser: 0.93 mg/dL (ref 0.44–1.00)
GFR, Estimated: >60 mL/min (ref >60)
Glucose, Bld: 104 mg/dL — ABNORMAL HIGH (ref 70–99)
Potassium: 3.7 mmol/L (ref 3.5–5.1)
Sodium: 140 mmol/L (ref 135–145)

## 2022-07-22 LAB — BRAIN NATRIURETIC PEPTIDE: B Natriuretic Peptide: 14 pg/mL (ref 0.0–100.0)

## 2022-07-22 MED ORDER — ALBUTEROL SULFATE (2.5 MG/3ML) 0.083% IN NEBU
2.5000 mg | INHALATION_SOLUTION | Freq: Once | RESPIRATORY_TRACT | Status: AC
  Administered 2022-07-22: 2.5 mg via RESPIRATORY_TRACT

## 2022-07-22 MED ORDER — LOSARTAN POTASSIUM 25 MG PO TABS: 25.0000 mg | ORAL_TABLET | Freq: Every day | ORAL | 3 refills | Status: AC

## 2022-07-22 NOTE — Patient Instructions (Addendum)
START Losartan 25 mg daily   Labs done today, your results will be available in MyChart, we will contact you for abnormal readings.  Your physician has requested that you have cardiac CT. Cardiac computed tomography (CT) is a painless test that uses an x-ray machine to take clear, detailed pictures of your heart. For further information please visit [email or web address] Please follow instruction sheet as given.   PLEASE CALL THE OFFICE ON [phone] PRESS OPTION 2 TO GET YOUR RIGHT HEART CATHETERIZATION SCHEDULED.  Your physician recommends that you schedule a follow-up appointment in: 6 months (October ) ** please call the office in August to arrange your follow up appointment. **  If you have any questions or concerns before your next appointment please send [place] a message through [place] or call our office at [phone].    TO LEAVE A MESSAGE FOR THE NURSE SELECT OPTION 2, PLEASE LEAVE A MESSAGE INCLUDING: YOUR NAME DATE OF BIRTH CALL BACK NUMBER REASON FOR CALL**this is important as we prioritize the call backs  YOU WILL RECEIVE A CALL BACK THE SAME DAY AS LONG AS YOU CALL BEFORE 4:00 PM  At the [facility], you and your health needs are our priority. As part of our continuing mission to provide you with exceptional heart care, we have created designated Provider Care Teams. These Care Teams include your primary Cardiologist (physician) and Advanced Practice Providers (APPs- Physician Assistants and Nurse Practitioners) who all work together to provide you with the care you need, when you need it.   You may see any of the following providers on your designated Care Team at your next follow up: Dr Arvilla Meres Dr Marca Ancona Dr. Marcos Eke, NP Robbie Lis, [place] [facility] [place], [place] Brynda Peon, NP Karle Plumber, PharmD   Please be sure to bring in all your medications bottles to every appointment.    Thank you for  choosing [facility]

## 2022-07-22 NOTE — Telephone Encounter (Signed)
Pt returned call to schedule RHC    [facility] [address] [id number] [facility] [place] [address] Dept: [phone] Loc: [phone]  Patricia Chambers  07/22/2022  You are scheduled for a Cardiac Catheterization on Friday, May 31 with Dr.  Gasper Lloyd .  1. Please arrive at the [facility] (Main Entrance A) at [facility]: [address] [place], [place] [address] at 700am (This time is 2 hour(s) before your procedure to ensure your preparation). Free valet parking service is available. You will check in at ADMITTING. The support person will be asked to wait in the waiting room.  It is OK to have someone drop you off and come back when you are ready to be discharged.    Special note: Every effort is made to have your procedure done on time. Please understand that emergencies sometimes delay scheduled procedures.  2. Diet: Do not eat solid foods after midnight.  The patient may have clear liquids until 5am upon the day of the procedure.  3. Labs: Pre procedure labs done 07/22/22. If additional labs are needed they will be done at arrival  4. Medication instructions in preparation for your procedure:   Contrast Allergy: No      On the morning of your procedure, take your Aspirin 81 mg and any morning medicines NOT listed above.  You may use sips of water.  5. Plan to go home the same day, you will only stay overnight if medically necessary. 6. Bring a current list of your medications and current insurance cards. 7. You MUST have a responsible person to drive you home. 8. Someone MUST be with you the first 24 hours after you arrive home or your discharge will be delayed. 9. Please wear clothes that are easy to get on and off and wear slip-on shoes.  Thank you for allowing [place] to care for you!   -- [facility]

## 2022-07-22 NOTE — Progress Notes (Signed)
ADVANCED [facility] FAILURE CLINIC NOTE  Referring Physician: Myrlene Broker, *  Primary Care: Myrlene Broker, MD Primary Cardiologist: Dr. Dietrich Pates  HPI: Patricia Chambers is a 72 y.o. female with undiagnosed autoimmune disease according to her, hypothyroidism presenting today for evaluation of pulmonary hypertension.According to Ms. Clearance Coots, she recently saw an ophthalmologist who was concerned for underlying autoiummune disease. She was subsequently referred to rheumatology for evaluation of Lupus due to elevated ANA/CK. She is currently followed by [facility] (Dr. Kathie Rhodes) where her work up was fairly unremarkable. As per Ms. Clearance Coots, they do not believe she has lupus but they are concerned for possibility of sarcoidosis. In 1974, she was hospitalized for diffuse lymphadenopathy and respiratory distress (age 14). She did not have a biopsy at that time but was told there was concern for sarcoid. Of note, also diagnosed with COVID19 in December 2023.   From a functional standpoint, [name] is fairly independent; she does not report of any chest pain. She becomes short of breath and fatigued now when going on slow walks with her son. During this time she also feels some pressure/tightness in her chest. She feels that these symptoms are now progressing  Activity level/exercise tolerance:  NYHA IIB  Orthopnea:  Sleeps on 2 pillows Paroxysmal noctural dyspnea:  no Chest pain/pressure:  very infrequent, atypical Orthostatic lightheadedness:  no Palpitations:  no Lower extremity edema:  no Presyncope/syncope:  no Cough:  no  Past Medical History:  Diagnosis Date   Allergy    Arthritis    Asthma    Cataract    Colon polyp    patient states she had 1 removed 20 years ago   Essential hypertension 06/21/2013   FHx: migraine headaches    Hx of colonic polyps    Hyperlipidemia    Hypertension    Sarcoidosis    Thyroid disease    Wears hearing aid    BILATERAL     Current Outpatient Medications  Medication Sig Dispense Refill   acetaminophen (TYLENOL) 500 MG tablet Take 500 mg by mouth as needed.     aspirin EC 81 MG tablet Take 1 tablet (81 mg total) by mouth daily. Swallow whole. 100 tablet 3   Carboxymethylcellul-Glycerin (REFRESH OPTIVE) 1-0.9 % GEL      Carboxymethylcellulose Sodium (REFRESH TEARS OP) Apply to eye in the morning and at bedtime. TAKE 2 DROPS TWICE A DAY     EPINEPHrine 0.3 mg/0.3 mL IJ SOAJ injection Inject 0.3 mg into the muscle as needed for anaphylaxis. 1 each 2   fluticasone (FLONASE) 50 MCG/ACT nasal spray Place 2 sprays into both nostrils daily as needed for allergies or rhinitis.     levothyroxine (SYNTHROID) 25 MCG tablet Take 1 tablet (25 mcg total) by mouth daily before breakfast. 90 tablet 2   Multiple Vitamins-Minerals (MULTIVITAMIN WITH MINERALS) tablet Take 1 tablet by mouth daily.     Omega-3 Fatty Acids (FISH OIL PO) Take 2 tablets by mouth daily.     ondansetron (ZOFRAN) 4 MG tablet Take 1 tablet (4 mg total) by mouth every 8 (eight) hours as needed for nausea or vomiting. 10 tablet 0   No current facility-administered medications for this encounter.    Allergies  Allergen Reactions   Naproxen     Esophageal burning sensation.     Penicillins Other (See Comments)    REACTION: Childhood      Social History   Socioeconomic History   Marital status: Widowed  Spouse name: Not on file   Number of children: Not on file   Years of education: Not on file   Highest education level: Not on file  Occupational History   Not on file  Tobacco Use   Smoking status: Former    Packs/day: 1.00    Years: 5.00    Additional pack years: 0.00    Total pack years: 5.00    Types: Cigarettes    Quit date: 03/28/1974    Years since quitting: 48.3    Passive exposure: Past (PARENTS SMOKED)   Smokeless tobacco: Never  Vaping Use   Vaping Use: Never used  Substance and Sexual Activity   Alcohol use: Not Currently     Alcohol/week: 5.0 standard drinks of alcohol    Types: 5 Glasses of wine per week    Comment: Or more   Drug use: No   Sexual activity: Not Currently  Other Topics Concern   Not on file  Social History Narrative   Not on file   Social Determinants of Health   Financial Resource Strain: Low Risk  (04/19/2022)   Overall Financial Resource Strain (CARDIA)    Difficulty of Paying Living Expenses: Not hard at all  Food Insecurity: No Food Insecurity (04/19/2022)   Hunger Vital Sign    Worried About Running Out of Food in the Last Year: Never true    Ran Out of Food in the Last Year: Never true  Transportation Needs: No Transportation Needs (04/19/2022)   PRAPARE - Administrator, Civil Service (Medical): No    Lack of Transportation (Non-Medical): No  Physical Activity: Sufficiently Active (04/19/2022)   Exercise Vital Sign    Days of Exercise per Week: 5 days    Minutes of Exercise per Session: 30 min  Stress: No Stress Concern Present (04/19/2022)   [employer] of Occupational Health - Occupational Stress Questionnaire    Feeling of Stress : Not at all  Social Connections: Socially Isolated (04/19/2022)   Social Connection and Isolation Panel [NHANES]    Frequency of Communication with Friends and Family: More than three times a week    Frequency of Social Gatherings with Friends and Family: More than three times a week    Attends Religious Services: Never    Database administrator or Organizations: No    Attends Banker Meetings: Never    Marital Status: Widowed  Intimate Partner Violence: Not At Risk (04/19/2022)   Humiliation, Afraid, Rape, and Kick questionnaire    Fear of Current or Ex-Partner: No    Emotionally Abused: No    Physically Abused: No    Sexually Abused: No      Family History  Problem Relation Age of Onset   Diverticulitis Mother    Heart disease Mother    Colon polyps Brother    Diverticulitis Brother    Crohn's disease  Child    Colon cancer Neg Hx    Pancreatic cancer Neg Hx    Esophageal cancer Neg Hx    Rectal cancer Neg Hx    Stomach cancer Neg Hx    Ulcerative colitis Neg Hx    Breast cancer Neg Hx     PHYSICAL EXAM: Vitals:   07/22/22 1102  BP: (!) 160/90  Pulse: 80  SpO2: 99%   GENERAL: Well nourished, well developed, and in no apparent distress at rest.  HEENT: Negative for arcus senilis or xanthelasma. There is no scleral icterus.  The mucous membranes  are pink and moist.   NECK: Supple, No masses. Normal carotid upstrokes without bruits. No masses or thyromegaly.    CHEST: There are no chest wall deformities. There is no chest wall tenderness. Respirations are unlabored.  Lungs- CTA B/L CARDIAC:  JVP: 7 cm H2O         Normal S1, S2  Normal rate with regular rhythm. No murmurs, rubs or gallops.  Pulses are 2+ and symmetrical in upper and lower extremities. no edema.  ABDOMEN: Soft, non-tender, non-distended. There are no masses or hepatomegaly. There are normal bowel sounds.  EXTREMITIES: Warm and well perfused with no cyanosis, clubbing.  LYMPHATIC: No axillary or supraclavicular lymphadenopathy.  NEUROLOGIC: Patient is oriented x3 with no focal or lateralizing neurologic deficits.  PSYCH: Patients affect is appropriate, there is no evidence of anxiety or depression.  SKIN: Warm and dry; no lesions or wounds.   DATA REVIEW  ECG: 07/22/22: NSR  As per my personal interpretation  ECHO: 07/22/22: LVEF 55%, normal RV function As per my personal interpretation  CATH: LHC 06/24/2013: Procedural Findings:  Hemodynamics:  AO 133/69  LV 136/10    Coronary angiography:  Coronary dominance: right   Left mainstem: The left main is widely patent it divides into the LAD and left circumflex.   Left anterior descending (LAD): The LAD is widely patent throughout. The proximal vessel is large in caliber with no stenosis. The first diagonal is normal in caliber with no stenosis. The LAD tapers  after the first diagonal branch and has no significant stenosis throughout its course.   Left circumflex (LCx): The left circumflex is patent and supplies the OM branch and left osterolateral branch.   Right coronary artery (RCA): Dominant vessel, no significant stenosis throughout, PDA and PLA branches are patent.   Left ventriculography: Left ventricular systolic function is normal, LVEF is estimated at 55-60%, there is no significant mitral regurgitation    ASSESSMENT & PLAN:  Evaluation for pulmonary hypertension -Currently followed by rheumatology in [place].  Will obtain outside records.  According to patient there is some concern for sarcoidosis however her echocardiogram demonstrates completely normal LVEF. -Plan for right heart catheterization due to progressively worsening shortness of breath.  If outside records do suggest sarcoidosis we will plan on cardiac MRI or cardiac PET and CT chest.  2.  Atypical chest pain/shortness of breath -Reports pressure in the epigastrium with excessive exertion.  Will obtain coronary CTA.  She is fearful of sedation due to complications in the past.  For this reason we will perform right heart cath and coronary evaluation via CTA.   Alfreddie Consalvo [facility] Mechanical Circulatory Support

## 2022-07-28 ENCOUNTER — Other Ambulatory Visit (HOSPITAL_COMMUNITY): Payer: Self-pay | Admitting: *Deleted

## 2022-07-28 DIAGNOSIS — R0609 Other forms of dyspnea: Secondary | ICD-10-CM

## 2022-08-02 ENCOUNTER — Telehealth: Payer: Self-pay | Admitting: Internal Medicine

## 2022-08-02 DIAGNOSIS — R0609 Other forms of dyspnea: Secondary | ICD-10-CM

## 2022-08-02 DIAGNOSIS — R0789 Other chest pain: Secondary | ICD-10-CM

## 2022-08-02 NOTE — Telephone Encounter (Signed)
States nothing is critical, but wanted to follow up and make sure Dr. Tenny Craw knows what is going on. She is sure that communication is being forwarded to Dr. Tenny Craw and she wants to make sure it is. One person at [facility] told her that Dr. Tenny Craw can look in epic to see results, when pt asked that they forward it to her. States Dr. Gasper Lloyd told pt he would see Dr. Tenny Craw in the hallway" and get information to her that way. Pt skeptical about what is being informed to Dr. Tenny Craw.  And she "wants Dr. Tenny Craw calling my cardiology shots".   Says that she somewhere in her chart to take ASA TID.  Advised pt she should only be taking it once daily.   Pt agreeable to plan.  She also reports that she started taking Losartan about a week and half ago and is having a good response from that - SOB improved.  Pt would like Dr. Charlott Rakes opinion on if RHC is really necessary, "seems pretty dramatic". Feels like this pulmonary htn "seems out of left field".  Aware will forward this to Dr. Tenny Craw and her nurse to address. Pt says this is not urgent, but would like to hear what Dr. Tenny Craw says/thinks.

## 2022-08-02 NOTE — Telephone Encounter (Signed)
Patient is requesting to speak with RN Dewayne Hatch or RN Berdine Addison.

## 2022-08-05 NOTE — Telephone Encounter (Signed)
Patricia Riffle, MD  [name], RN Caller: Unspecified (3 days ago, 12:05 PM) I have reviewed note from pulmonary HTN clinic.   I am not sure how referred there   Please find referral I have reviewed echo    Hold on R heart cath for right now I agree with coronary CTA   (Has this been ordered correctly?) Need outside records

## 2022-08-05 NOTE — Telephone Encounter (Signed)
I called the pt to reassure her that we are working on her plan.. and I will follow up with her early next week.   I will double check with Heather re: the CT and the correct order has been placed.   The pt was grateful for my call.

## 2022-08-09 MED ORDER — METOPROLOL TARTRATE 100 MG PO TABS: ORAL_TABLET | ORAL | 0 refills | Status: AC

## 2022-08-09 NOTE — Telephone Encounter (Signed)
I spoke with Heather in the HF clinic and she will cancel the pts right Heart Cath for now per Dr Tenny Craw. I will order the cardiac CT which replace the current order for the Chest CT aorta which may have been placed in error.   Will follow up with the pt.

## 2022-08-17 ENCOUNTER — Telehealth (HOSPITAL_COMMUNITY): Payer: Self-pay | Admitting: *Deleted

## 2022-08-17 NOTE — Telephone Encounter (Signed)
Reaching out to patient to offer assistance regarding upcoming cardiac imaging study; pt verbalizes understanding of appt date/time, parking situation and where to check in, pre-test NPO status and medications ordered, and verified current allergies; name and call back number provided for further questions should they arise  Larey Brick RN Navigator Cardiac Imaging [facility] [phone] office [phone] cell  Patient to hold her losartan and will take 100mg  metoprolol tartrate two hours prior to her cardiac CT scan.  She is aware to arrive at 9:30am.

## 2022-08-19 ENCOUNTER — Ambulatory Visit (HOSPITAL_COMMUNITY)
Admission: RE | Admit: 2022-08-19 | Discharge: 2022-08-19 | Disposition: A | Discharge status: Home / Self Care | Payer: Medicare Other | Source: Ambulatory Visit | Attending: Internal Medicine | Admitting: Internal Medicine

## 2022-08-19 ENCOUNTER — Telehealth: Payer: Self-pay

## 2022-08-19 DIAGNOSIS — I2510 Atherosclerotic heart disease of native coronary artery without angina pectoris: Secondary | ICD-10-CM | POA: Diagnosis not present

## 2022-08-19 DIAGNOSIS — R0789 Other chest pain: Secondary | ICD-10-CM | POA: Insufficient documentation

## 2022-08-19 DIAGNOSIS — E039 Hypothyroidism, unspecified: Secondary | ICD-10-CM | POA: Diagnosis not present

## 2022-08-19 DIAGNOSIS — R072 Precordial pain: Secondary | ICD-10-CM

## 2022-08-19 DIAGNOSIS — I272 Pulmonary hypertension, unspecified: Secondary | ICD-10-CM | POA: Insufficient documentation

## 2022-08-19 DIAGNOSIS — I700 Atherosclerosis of aorta: Secondary | ICD-10-CM | POA: Insufficient documentation

## 2022-08-19 DIAGNOSIS — R0609 Other forms of dyspnea: Secondary | ICD-10-CM | POA: Insufficient documentation

## 2022-08-19 MED ORDER — NITROGLYCERIN 0.4 MG SL SUBL
0.8000 mg | SUBLINGUAL_TABLET | Freq: Once | SUBLINGUAL | Status: AC
  Administered 2022-08-19: 0.8 mg via SUBLINGUAL

## 2022-08-19 MED ORDER — IOHEXOL 350 MG/ML SOLN
100.0000 mL | Freq: Once | INTRAVENOUS | Status: AC | PRN
  Administered 2022-08-19: 100 mL via INTRAVENOUS

## 2022-08-19 MED ORDER — NITROGLYCERIN 0.4 MG SL SUBL
SUBLINGUAL_TABLET | SUBLINGUAL | Status: AC
  Filled 2022-08-19: qty 2

## 2022-08-19 NOTE — Progress Notes (Signed)
Pt provided with water and offered a snack. Pt drank water and walked out to valet with RN. Pt states she feels safe to drive home. Pt walked without any deficits noted and did not report any concerns to this RN. Pt left at 1018

## 2022-08-19 NOTE — Telephone Encounter (Signed)
-----   Message from Pricilla Riffle, MD sent at 08/19/2022  3:57 PM EDT ----- CT shows no calcified plaque but very mild noncalcified plaque  SHeis intolerant to statins due to CK elevation Last LDL 156 with 1870 particles   Trig 177 1.  Recomm Repatha   Follow up lipomed and liver panel in 8 wk 2   Minimze carbs, sugar, processed food

## 2022-08-26 ENCOUNTER — Ambulatory Visit (HOSPITAL_COMMUNITY): Admit: 2022-08-26 | Payer: Medicare Other | Admitting: Cardiology

## 2022-08-26 ENCOUNTER — Encounter (HOSPITAL_COMMUNITY): Payer: Self-pay

## 2022-08-26 SURGERY — RIGHT HEART CATH
Anesthesia: LOCAL

## 2022-08-26 NOTE — Telephone Encounter (Signed)
Tests results show no evidence for pulmonary hypertension OK to try diet   Follow lipomed next winter

## 2022-08-29 ENCOUNTER — Ambulatory Visit (HOSPITAL_COMMUNITY): Payer: Medicare Other

## 2022-09-21 IMAGING — MG MM DIGITAL SCREENING BILAT W/ TOMO AND CAD
8 series · 8 of 24 positions shown · non-contrast
Comparison: Previous exam(s).

CLINICAL DATA: Screening.

EXAM:
DIGITAL SCREENING BILATERAL MAMMOGRAM WITH TOMOSYNTHESIS AND CAD
TECHNIQUE: Bilateral screening digital craniocaudal and mediolateral oblique
mammograms were obtained. Bilateral screening digital breast
tomosynthesis was performed. The images were evaluated with
computer-aided detection.

[R MLO synth-2D]
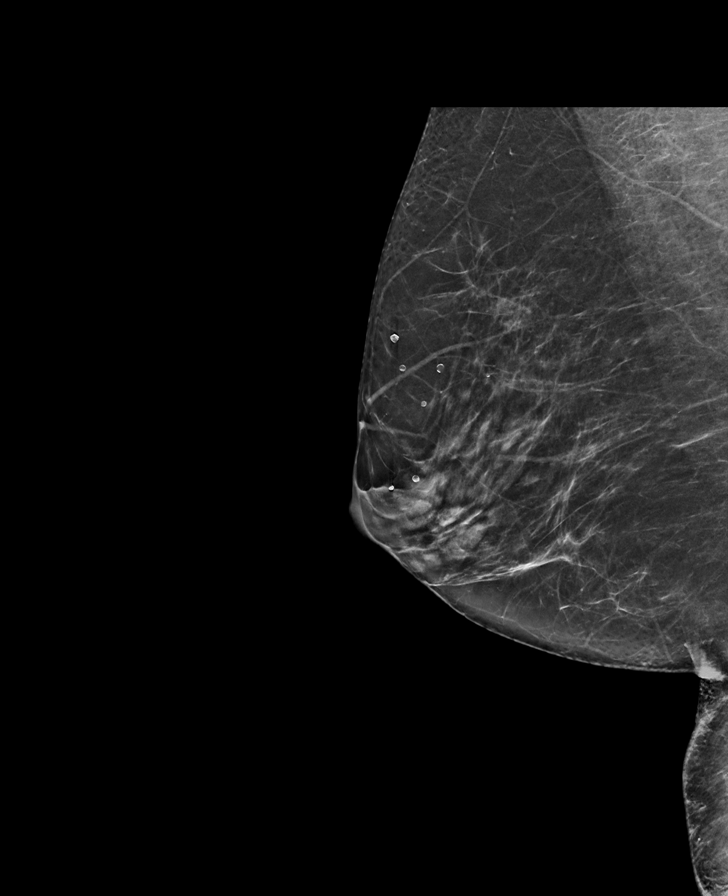

[L MLO synth-2D]
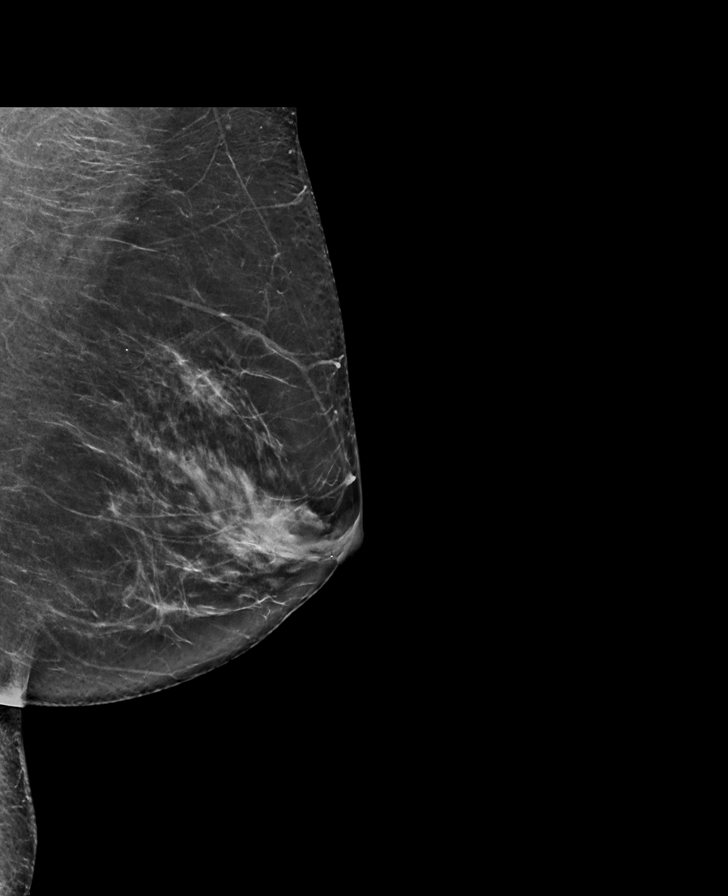

[L CC synth-2D]
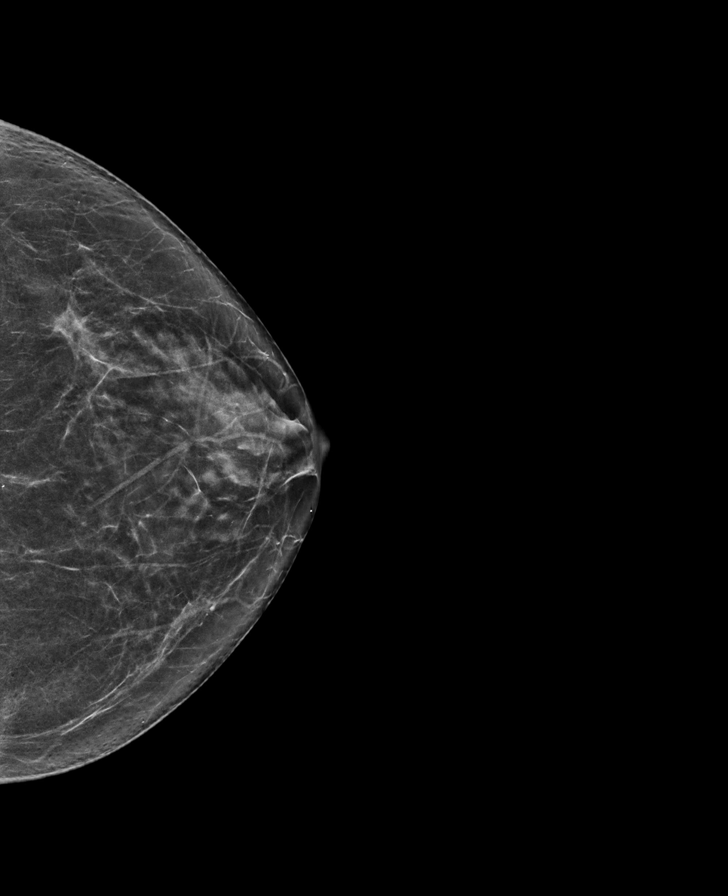

[R CC synth-2D]
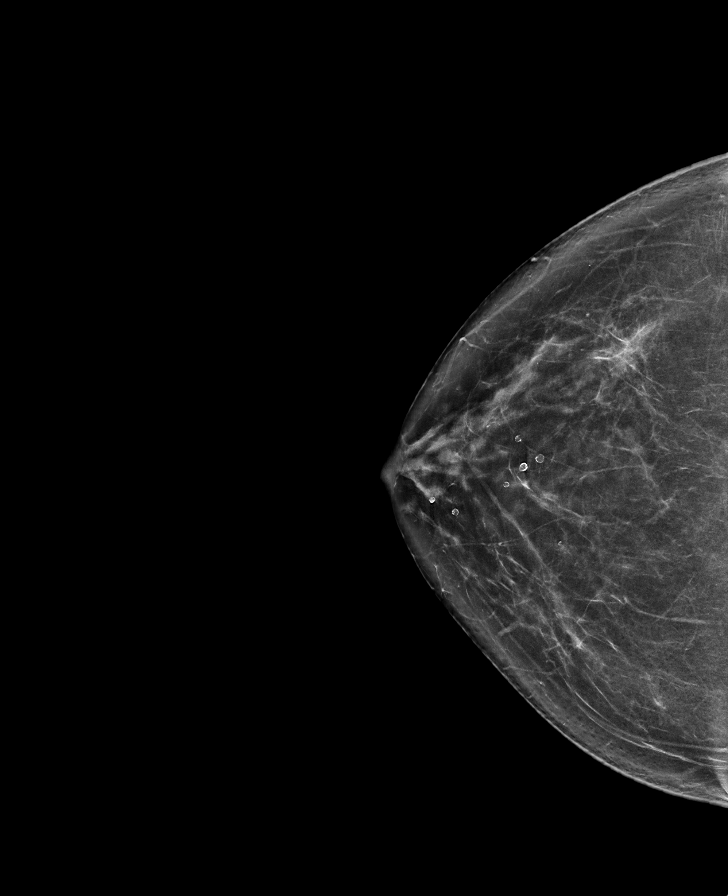

[L MLO tomo · tomo slice 36/71.0]
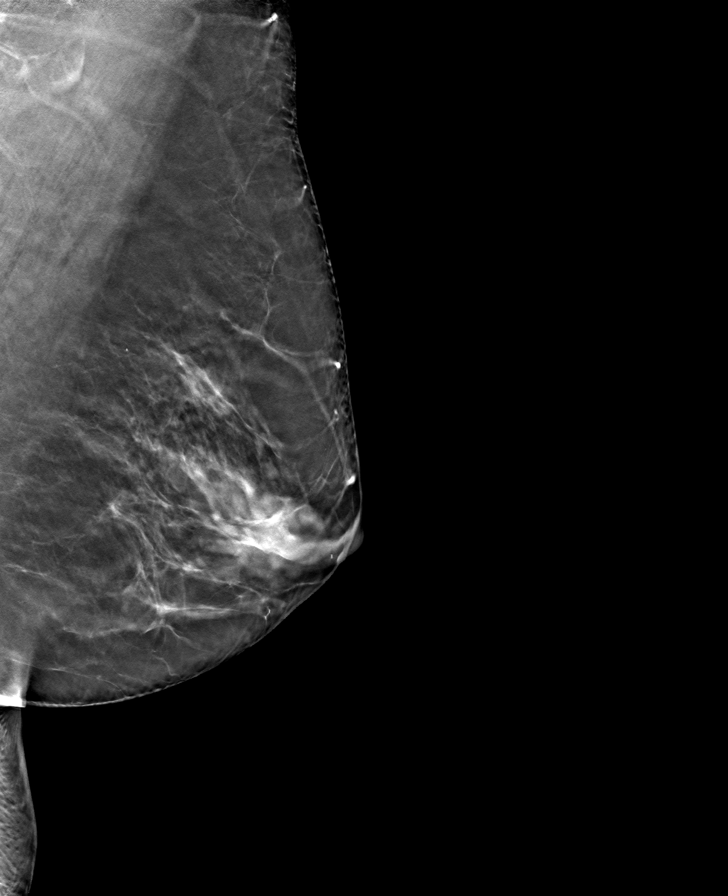

[L CC tomo · tomo slice 34/67.0]
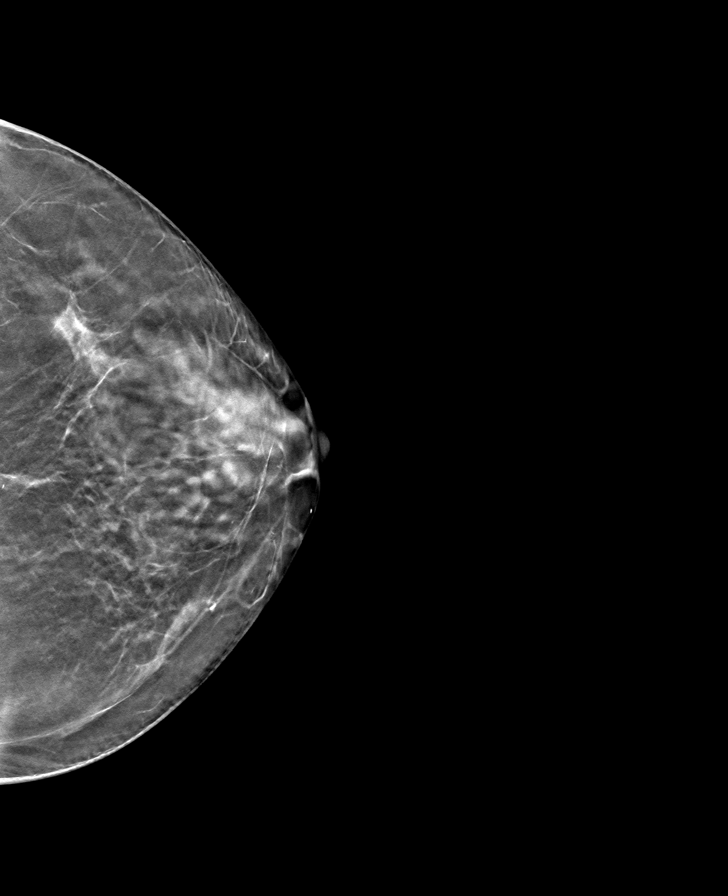

[R CC tomo · tomo slice 37/74.0]
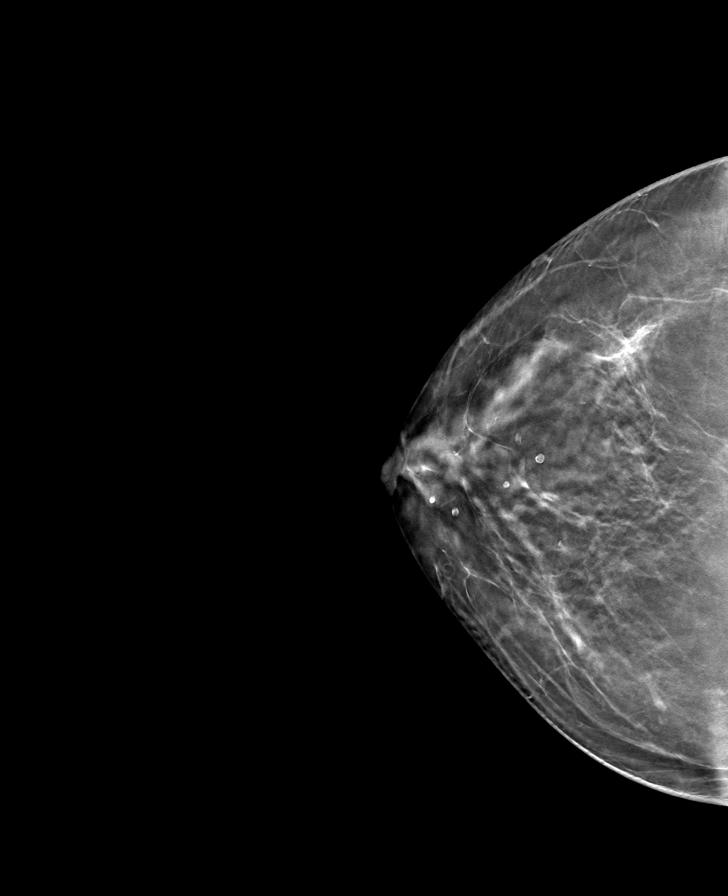

[R MLO tomo · tomo slice 35/68.0]
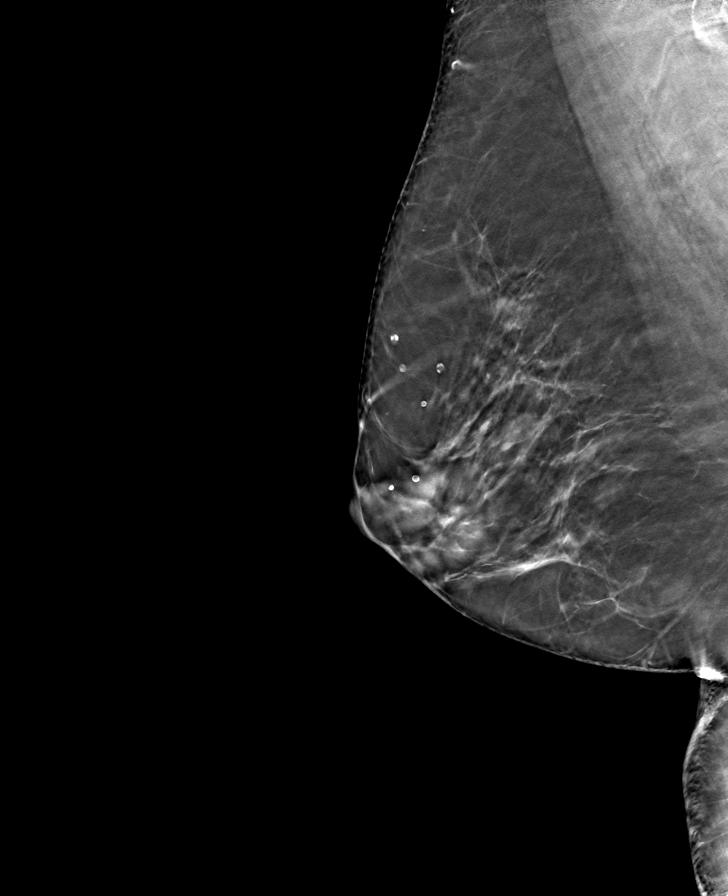

[8 of 24 positions shown; findings below may reference images not displayed]

ACR Breast Density Category b: There are scattered areas of
fibroglandular density.
FINDINGS: There are no findings suspicious for malignancy.
IMPRESSION: No mammographic evidence of malignancy. A result letter of this
screening mammogram will be mailed directly to the patient.

RECOMMENDATION:
Screening mammogram in one year. (Code:51-O-LD2)

BI-RADS CATEGORY  1: Negative.

## 2023-03-01 ENCOUNTER — Other Ambulatory Visit: Payer: Self-pay | Admitting: Internal Medicine

## 2023-03-01 DIAGNOSIS — Z1231 Encounter for screening mammogram for malignant neoplasm of breast: Secondary | ICD-10-CM

## 2023-04-06 ENCOUNTER — Ambulatory Visit
Admission: RE | Admit: 2023-04-06 | Discharge: 2023-04-06 | Disposition: A | Discharge status: Home / Self Care | Payer: Medicare Other | Source: Ambulatory Visit | Attending: Internal Medicine | Admitting: Internal Medicine

## 2023-04-06 DIAGNOSIS — Z1231 Encounter for screening mammogram for malignant neoplasm of breast: Secondary | ICD-10-CM

## 2023-04-10 LAB — HM MAMMOGRAPHY

## 2023-04-11 ENCOUNTER — Encounter: Payer: Self-pay | Admitting: Family Medicine

## 2023-04-17 NOTE — Progress Notes (Unsigned)
Cardiology Office Note   Date:  04/18/2023   ID:  Patricia Chambers, DOB 07-21-1950, MRN [id number]  PCP:  Farris Has, MD  Cardiologist:   Dietrich Pates, MD   Pt presents for f/u of CP      History of Present Illness: Patricia Chambers is a 73 y.o. female with a history of CP   I saw her in 2015 for CP  She had a myoview scan which showed scar with mild periinfarct ischemia   This lead to [facility] which was normal    Carotid USN   minimal plaquing     2023 PT had COVID   BP  higher after  infection.   The pt had problems with dry eyes   ANA +   CK 251   Seen by rheumatology    I saw the pt in Jan 2024  Since seen she stopped statin  Says she feels [facility] better    No achiness   Active   She has also lost about 40 lbs   Walking, exercising now  Feels good She denies CP   No dizziness       Current Meds  Medication Sig   acetaminophen (TYLENOL) 500 MG tablet Take 250 mg by mouth as needed.   aspirin EC 81 MG tablet Take 1 tablet (81 mg total) by mouth daily. Swallow whole.   Carboxymethylcellul-Glycerin (REFRESH OPTIVE) 1-0.9 % GEL    Carboxymethylcellulose Sodium (REFRESH TEARS OP) Apply to eye in the morning and at bedtime. TAKE 2 DROPS TWICE A DAY   EPINEPHrine 0.3 mg/0.3 mL IJ SOAJ injection Inject 0.3 mg into the muscle as needed for anaphylaxis.   levothyroxine (SYNTHROID) 25 MCG tablet Take 1 tablet (25 mcg total) by mouth daily before breakfast.   Multiple Vitamins-Minerals (MULTIVITAMIN WITH MINERALS) tablet Take 1 tablet by mouth daily.     Allergies:   Fluogen [influenza virus vaccine], Naproxen, and Penicillins   Past Medical History:  Diagnosis Date   Allergy    Arthritis    Asthma    Cataract    Colon polyp    patient states she had 1 removed 20 years ago   Essential hypertension 06/21/2013   FHx: migraine headaches    Hx of colonic polyps    Hyperlipidemia    Hypertension    Sarcoidosis    Thyroid disease    Wears hearing aid    BILATERAL    Past Surgical  History:  Procedure Laterality Date   BROKEN ANKLE Right    BOOT   COLONOSCOPY     COLONOSCOPY WITH PROPOFOL N/A 12/17/2020   Procedure: COLONOSCOPY WITH PROPOFOL;  Surgeon: Napoleon Form, MD;  Location: [facility];  Service: Endoscopy;  Laterality: N/A;   ENDOSCOPIC MUCOSAL RESECTION N/A 12/17/2020   Procedure: ENDOSCOPIC MUCOSAL RESECTION;  Surgeon: Napoleon Form, MD;  Location: [facility];  Service: Endoscopy;  Laterality: N/A;   HAMSTRING TEAR Left    LEFT HEART CATHETERIZATION WITH CORONARY ANGIOGRAM N/A 06/24/2013   Procedure: LEFT HEART CATHETERIZATION WITH CORONARY ANGIOGRAM;  Surgeon: Micheline Chapman, MD;  Location: [facility] CATH LAB;  Service: Cardiovascular;  Laterality: N/A;   POLYPECTOMY  12/17/2020   Procedure: POLYPECTOMY;  Surgeon: Napoleon Form, MD;  Location: [facility];  Service: Endoscopy;;   POLYPECTOMY     SUBMUCOSAL LIFTING INJECTION  12/17/2020   Procedure: SUBMUCOSAL LIFTING INJECTION;  Surgeon: Napoleon Form, MD;  Location: [facility];  Service: Endoscopy;;   TEMPOROMANDIBULAR JOINT SURGERY  Social History:  The patient  reports that she quit smoking about 49 years ago. Her smoking use included cigarettes. She started smoking about 54 years ago. She has a 5 pack-year smoking history. She has been exposed to tobacco smoke. She has never used smokeless tobacco. She reports that she does not currently use alcohol after a past usage of about 5.0 standard drinks of alcohol per week. She reports that she does not use drugs.   Family History:  The patient's family history includes Colon polyps in her brother; Crohn's disease in her child; Diverticulitis in her brother and mother; Heart disease in her mother.    ROS:  Please see the history of present illness. All other systems are reviewed and  Negative to the above problem except as noted.    PHYSICAL EXAM: VS:  BP 124/62   Pulse 82   Ht 5\' 7"  (1.702 m)   Wt 161 lb (73 kg)   SpO2  97%   BMI 25.22 kg/m   GEN: Obese  73 yo  , in no acute distress  HEENT: normal  Neck: no JVD, carotid bruits Cardiac: RRR; no murmurs  No LE edema  Respiratory:  clear to auscultation GI: soft, nontender   No hepatomegaly  MS: no deformity Moving all extremities    EKG:  EKG is not ordered today   Lipid Panel    Component Value Date/Time   CHOL 128 12/16/2021 1033   CHOL 138 03/24/2021 0911   TRIG 147.0 12/16/2021 1033   HDL 49.30 12/16/2021 1033   HDL 50 03/24/2021 0911   CHOLHDL 3 12/16/2021 1033   VLDL 29.4 12/16/2021 1033   LDLCALC 50 12/16/2021 1033   LDLCALC 63 03/24/2021 0911      Wt Readings from Last 3 Encounters:  04/18/23 161 lb (73 kg)  07/22/22 193 lb (87.5 kg)  04/19/22 199 lb (90.3 kg)      ASSESSMENT AND PLAN:  1  CAD   Ca score is 0   CCTA in May 2024 shows minimal soft plaque  2   HL  Pt felt bad on Crestor   Off now   Feeling better  Lipds in Oct LDL 127  HDL 46  Trig 155   Reviewed diet    Stay active I would repeat lipomed this spring    3  Blood pressure   Pt currently on 25 mg losartan  BP is controlled   Follow   4  Rheum  Pt had + ANA and elevated CK    SHe is feeling good now    Says since stopping Crestor and starting losartan I would recomm repeating CK and ANA to see if still elevated and +        Current medicines are reviewed at length with the patient today.  The patient does not have concerns regarding medicines.  Signed, Dietrich Pates, MD  04/18/2023 11:42 AM    [facility] [address] [place], [place], [place]  [address] Phone: [phone]; Fax: [phone]

## 2023-04-18 ENCOUNTER — Ambulatory Visit: Payer: Medicare Other | Attending: Internal Medicine | Admitting: Internal Medicine

## 2023-04-18 VITALS — BP 124/62 | HR 82 | Ht 67.0 in | Wt 161.0 lb

## 2023-04-18 DIAGNOSIS — E7849 Other hyperlipidemia: Secondary | ICD-10-CM | POA: Diagnosis not present

## 2023-04-18 NOTE — Patient Instructions (Signed)
Medication Instructions:  None  *If you need a refill on your cardiac medications before your next appointment, please call your pharmacy*   Lab Work: Primary care to perform labs: Lipomed Panel, Lpa, Apo B, CK, ANA  If you have labs (blood work) drawn today and your tests are completely normal, you will receive your results only by: MyChart Message (if you have MyChart) OR A paper copy in the mail If you have any lab test that is abnormal or we need to change your treatment, we will call you to review the results.   Testing/Procedures: none   Follow-Up: At [facility], you and your health needs are our priority.  As part of our continuing mission to provide you with exceptional heart care, we have created designated Provider Care Teams.  These Care Teams include your primary Cardiologist (physician) and Advanced Practice Providers (APPs -  Physician Assistants and Nurse Practitioners) who all work together to provide you with the care you need, when you need it.  We recommend signing up for the patient portal called "MyChart".  Sign up information is provided on this After Visit Summary.  MyChart is used to connect with patients for Virtual Visits (Telemedicine).  Patients are able to view lab/test results, encounter notes, upcoming appointments, etc.  Non-urgent messages can be sent to your provider as well.   To learn more about what you can do with MyChart, go to [email or web address].    Your next appointment:   7 month(s) in August   Provider:   Dietrich Pates, MD

## 2024-01-10 LAB — LAB REPORT - SCANNED: EGFR: 71

## 2024-01-17 ENCOUNTER — Other Ambulatory Visit (HOSPITAL_BASED_OUTPATIENT_CLINIC_OR_DEPARTMENT_OTHER): Payer: Self-pay | Admitting: Family Medicine

## 2024-01-17 DIAGNOSIS — R1010 Upper abdominal pain, unspecified: Secondary | ICD-10-CM

## 2024-01-18 ENCOUNTER — Other Ambulatory Visit (HOSPITAL_BASED_OUTPATIENT_CLINIC_OR_DEPARTMENT_OTHER): Payer: Self-pay | Admitting: Family Medicine

## 2024-01-18 DIAGNOSIS — M8580 Other specified disorders of bone density and structure, unspecified site: Secondary | ICD-10-CM

## 2024-01-21 NOTE — Progress Notes (Unsigned)
 Cardiology Office Note   Date:  01/26/2024   ID:  Patricia Chambers, DOB [birth year], MRN [id number]  PCP:  Kip Righter, MD  Cardiologist:   Vina Gull, MD   Pt presents for follow up of CP, HTN, HL      History of Present Illness: Patricia Chambers is a 73 y.o. female with a history of CP   I saw her in 2015 for CP  She had a myoview scan which showed scar with mild periinfarct ischemia   This lead to [facility] which was normal    Carotid USN   minimal plaquing     2023 PT had COVID   BP  higher after  infection.   The pt had problems with dry eyes   ANA +   CK 251   Seen by rheumatology    I saw the pt in Jan 2024  Since seen she stopped statin  Says she feels [facility] better    No achiness   Active   She has also lost about 40 lbs   Walking, exercising now  Feels good She denies CP   No dizziness     I saw the pt in Jan 2025  She continues to feel very good  Is on no BP meds    She says after dstopping statin in 2024 she noticed improvement in balance, psychomotor abilities  A few months ago she broke out in hives   Had bone and joint pain   Occurred after at tetanus shot   Being seen by Rheumatology    Labs just drawn.   SInce then symptoms have improved   The pt denies SOB  Denies CP  No palpitations  No dizziness  Diet:  WHole food, plant based Walks regularly    Current Meds  Medication Sig   acetaminophen  (TYLENOL ) 500 MG tablet Take 250 mg by mouth as needed.   Ascorbic Acid  500 MG CAPS as directed Orally   aspirin  EC 81 MG tablet Take 1 tablet (81 mg total) by mouth daily. Swallow whole.   Carboxymethylcellul-Glycerin (REFRESH OPTIVE) 1-0.9 % GEL    Carboxymethylcellulose Sodium (REFRESH TEARS OP) Apply to eye in the morning and at bedtime. TAKE 2 DROPS TWICE A DAY   EPINEPHrine  0.3 mg/0.3 mL IJ SOAJ injection Inject 0.3 mg into the muscle as needed for anaphylaxis.   levothyroxine  (SYNTHROID ) 25 MCG tablet Take 1 tablet (25 mcg total) by mouth daily before breakfast.   Multiple  Vitamins-Minerals (MULTIVITAMIN WITH MINERALS) tablet Take 1 tablet by mouth daily.   Omega 3 1000 MG CAPS 1 capsule. (Patient taking differently: 1 capsule. Per patient taking 690 mg daily)     Allergies:   Fluogen [influenza virus vaccine], Naproxen, Penicillins, and Rosuvastatin    Past Medical History:  Diagnosis Date   Allergy     Arthritis    Asthma    Cataract    Colon polyp    patient states she had 1 removed 20 years ago   Essential hypertension 06/21/2013   FHx: migraine headaches    Hx of colonic polyps    Hyperlipidemia    Hypertension    Sarcoidosis    Thyroid  disease    Wears hearing aid    BILATERAL    Past Surgical History:  Procedure Laterality Date   BROKEN ANKLE Right    BOOT   COLONOSCOPY     COLONOSCOPY WITH PROPOFOL  N/A 12/17/2020   Procedure: COLONOSCOPY WITH PROPOFOL ;  Surgeon: Nandigam, Kavitha V, MD;  Location: [facility];  Service: Endoscopy;  Laterality: N/A;   ENDOSCOPIC MUCOSAL RESECTION N/A 12/17/2020   Procedure: ENDOSCOPIC MUCOSAL RESECTION;  Surgeon: Shila Gustav GAILS, MD;  Location: [facility];  Service: Endoscopy;  Laterality: N/A;   HAMSTRING TEAR Left    LEFT HEART CATHETERIZATION WITH CORONARY ANGIOGRAM N/A 06/24/2013   Procedure: LEFT HEART CATHETERIZATION WITH CORONARY ANGIOGRAM;  Surgeon: Ozell JONETTA Fell, MD;  Location: [facility] CATH LAB;  Service: Cardiovascular;  Laterality: N/A;   POLYPECTOMY  12/17/2020   Procedure: POLYPECTOMY;  Surgeon: Shila Gustav GAILS, MD;  [facility];  Service: Endoscopy;;   POLYPECTOMY     SUBMUCOSAL LIFTING INJECTION  12/17/2020   Procedure: SUBMUCOSAL LIFTING INJECTION;  Surgeon: Shila Gustav GAILS, MD;  Location: [facility];  Service: Endoscopy;;   TEMPOROMANDIBULAR JOINT SURGERY       Social History:  The patient  reports that she quit smoking about 49 years ago. Her smoking use included cigarettes. She started smoking about 54 years ago. She has a 5 pack-year smoking history. She  has been exposed to tobacco smoke. She has never used smokeless tobacco. She reports that she does not currently use alcohol after a past usage of about 5.0 standard drinks of alcohol per week. She reports that she does not use drugs.   Family History:  The patient's family history includes Colon polyps in her brother; Crohn's disease in her child; Diverticulitis in her brother and mother; Heart disease in her mother.    ROS:  Please see the history of present illness. All other systems are reviewed and  Negative to the above problem except as noted.    PHYSICAL EXAM: VS:  BP 124/84 (BP Location: Left Arm, Patient Position: Sitting)   Pulse 82   Ht 5' 6 (1.676 m)   Wt 149 lb 9.6 oz (67.9 kg)   SpO2 97%   BMI 24.15 kg/m   GEN: Pt is in no acute distress  HEENT: normal  Neck: no JVD, carotid bruits Cardiac: RRR; no murmurs   Respiratory:  clear to auscultation GI: soft, nontender   No hepatomegaly  Ext  No LE edema   EKG:  EKG is  ordered today 82 bpm SR   Lipid Panel    Component Value Date/Time   CHOL 128 12/16/2021 1033   CHOL 138 03/24/2021 0911   TRIG 147.0 12/16/2021 1033   HDL 49.30 12/16/2021 1033   HDL 50 03/24/2021 0911   CHOLHDL 3 12/16/2021 1033   VLDL 29.4 12/16/2021 1033   LDLCALC 50 12/16/2021 1033   LDLCALC 63 03/24/2021 0911      Wt Readings from Last 3 Encounters:  01/26/24 149 lb 9.6 oz (67.9 kg)  04/18/23 161 lb (73 kg)  07/22/22 193 lb (87.5 kg)      ASSESSMENT AND PLAN:  1  CAD  CCTA in 2024 showed minimal soft plaque  Ca score 0  Plan to manage risks   2  HL Last lipids by PCP LDL was 104  Trig 82   Will repeat NMR panel in the spring along with Lpa and A1C     3  Blood pressure  Normal  Off of meds    Keep active     4  Rheum  Had been see in past for CK elevation, elevated ANA   While on statin  Off of statin normalized  She has seen rheum recently after hives event   Labs drawn   Will review  PLan for follow  up next summer  2026    Current medicines are reviewed at length with the patient today.  The patient does not have concerns regarding medicines.  Signed, Vina Gull, MD  01/26/2024 11:39 AM

## 2024-01-24 ENCOUNTER — Ambulatory Visit (HOSPITAL_BASED_OUTPATIENT_CLINIC_OR_DEPARTMENT_OTHER)
Admission: RE | Admit: 2024-01-24 | Discharge: 2024-01-24 | Disposition: A | Discharge status: Home / Self Care | Source: Ambulatory Visit | Attending: Family Medicine | Admitting: Family Medicine

## 2024-01-24 DIAGNOSIS — R1010 Upper abdominal pain, unspecified: Secondary | ICD-10-CM | POA: Insufficient documentation

## 2024-01-26 ENCOUNTER — Ambulatory Visit: Attending: Internal Medicine | Admitting: Internal Medicine

## 2024-01-26 VITALS — BP 124/84 | HR 82 | Ht 66.0 in | Wt 149.6 lb

## 2024-01-26 DIAGNOSIS — E785 Hyperlipidemia, unspecified: Secondary | ICD-10-CM | POA: Diagnosis not present

## 2024-01-26 DIAGNOSIS — Z8249 Family history of ischemic heart disease and other diseases of the circulatory system: Secondary | ICD-10-CM | POA: Diagnosis not present

## 2024-01-26 DIAGNOSIS — I700 Atherosclerosis of aorta: Secondary | ICD-10-CM | POA: Diagnosis not present

## 2024-01-26 NOTE — Patient Instructions (Addendum)
 Medication Instructions:  Your physician recommends that you continue on your current medications as directed. Please refer to the Current Medication list given to you today.  *If you need a refill on your cardiac medications before your next appointment, please call your pharmacy*  Lab Work: None ordered.  You may go to any [facility] Location for your lab work:  [facility] - [address] ([facility]) - [address] [phone] [address]  [place] - [address]   [place]  - [address]   [place] - [address] - [address] [employer]  - [address] [place] - [address] ([facility]   If you have labs (blood work) drawn today and your tests are completely normal, you will receive your results only by: [employer] (if you have MyChart)  If you have any lab test that is abnormal or we need to change your treatment, we will call you or send a MyChart message to review the results.  Testing/Procedures: None ordered.  Follow-Up: At [facility], you and your health needs are our priority.  As part of our continuing mission to provide you with exceptional heart care, we have created designated Provider Care Teams.  These Care Teams include your primary Cardiologist (physician) and Advanced Practice Providers (APPs -  Physician Assistants and Nurse Practitioners) who all work together to provide you with the care you need, when you need it.  Your next appointment:   July/ August 2026  The format for your next appointment:   In Person  Provider:   Vina Gull, MD
